# Patient Record
Sex: Male | Born: 1985 | Hispanic: No | Marital: Married | Smoking: Never smoker
Health system: Southern US, Community
[De-identification: ages and names within clinical notes are randomized; demographics above are authoritative.]

## PROBLEM LIST (undated history)

## (undated) DIAGNOSIS — K219 Gastro-esophageal reflux disease without esophagitis: Secondary | ICD-10-CM

## (undated) DIAGNOSIS — G8929 Other chronic pain: Secondary | ICD-10-CM

## (undated) DIAGNOSIS — F909 Attention-deficit hyperactivity disorder, unspecified type: Secondary | ICD-10-CM

## (undated) DIAGNOSIS — R6882 Decreased libido: Secondary | ICD-10-CM

## (undated) DIAGNOSIS — F419 Anxiety disorder, unspecified: Secondary | ICD-10-CM

## (undated) DIAGNOSIS — E291 Testicular hypofunction: Secondary | ICD-10-CM

## (undated) DIAGNOSIS — Z87898 Personal history of other specified conditions: Secondary | ICD-10-CM

## (undated) DIAGNOSIS — M549 Dorsalgia, unspecified: Secondary | ICD-10-CM

## (undated) HISTORY — DX: Gastro-esophageal reflux disease without esophagitis: K21.9

## (undated) HISTORY — DX: Anxiety disorder, unspecified: F41.9

## (undated) HISTORY — DX: Attention-deficit hyperactivity disorder, unspecified type: F90.9

## (undated) HISTORY — DX: Testicular hypofunction: E29.1

## (undated) HISTORY — DX: Decreased libido: R68.82

## (undated) HISTORY — PX: NASAL SEPTUM SURGERY: SHX37

## (undated) HISTORY — DX: Other chronic pain: G89.29

## (undated) HISTORY — DX: Personal history of other specified conditions: Z87.898

## (undated) HISTORY — PX: WISDOM TOOTH EXTRACTION: SHX21

## (undated) HISTORY — DX: Dorsalgia, unspecified: M54.9

---
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
## 2007-08-03 ENCOUNTER — Ambulatory Visit (HOSPITAL_BASED_OUTPATIENT_CLINIC_OR_DEPARTMENT_OTHER): Admission: RE | Admit: 2007-08-03 | Discharge: 2007-08-03 | Payer: Self-pay | Admitting: Orthopedic Surgery

## 2007-10-08 DIAGNOSIS — F419 Anxiety disorder, unspecified: Secondary | ICD-10-CM

## 2007-10-08 HISTORY — DX: Anxiety disorder, unspecified: F41.9

## 2011-02-19 NOTE — Op Note (Signed)
NAME:  Jose Briggs, Jose Briggs NO.:  [id number]   MEDICAL RECORD NO.:  [id number]          PATIENT TYPE:  AMB   LOCATION:  [facility]                          FACILITY:  [facility]   PHYSICIAN:  Robert A. Thurston Hole, M.D. DATE OF BIRTH:  27-Nov-1985   DATE OF PROCEDURE:  08/03/2007  DATE OF DISCHARGE:                               OPERATIVE REPORT   PREOPERATIVE DIAGNOSES:  1. Left shoulder instability with Bankart lesion.  2. Left shoulder anterior SLAP tear.  3. Left shoulder loose body.  4. Left shoulder chondromalacia.   POSTOPERATIVE DIAGNOSES:  1. Left shoulder instability with Bankart lesion.  2. Left shoulder anterior SLAP tear.  3. Left shoulder loose body.  4. Left shoulder chondromalacia.   PROCEDURE:  1. Left shoulder EUA, followed by examination under anesthesia (EUA),      followed by arthroscopically-assisted Bankart repair using Arthrex      PushLock anchors x2.  2. Left shoulder anterior SLAP repair using Arthrex PushLock anchor      x1.  3. Left shoulder loose body excision.  4. Left shoulder chondroplasty.   SURGEON:  Elana Alm. Thurston Hole, M.D.   ASSISTANT:  Julien Girt, P.A.   ANESTHESIA:  General.   OPERATIVE TIME:  1 hour.   COMPLICATIONS:  None.   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  Exam under MRI has revealed a Bankart lesion and SLAP tear and loose  body.  He has failed conservative care and is now to undergo arthroscopy  and repair.   DESCRIPTION:  Jose Briggs was brought to the recovery room on August 03, 2007, after an interscalene block was placed in the holding room by  anesthesia.  He was placed on the operating room table in the supine  position.  After being placed under general anesthesia, his left  shoulder was examined.  He had full range of motion with  anterior-  inferior laxity on the left, more so than the right.  No posterior  instability.  He was then placed in a beach chair position and the  shoulder and arm were prepped using sterile DuraPrep and draped using  sterile technique.  He received Ancef 1 g preoperatively for  prophylaxis.  Initially, through a posterior arthroscopic portal the  arthroscope with a pump attachment was placed into an anterior portal  and arthroscopic probe was placed.  On initial inspection, there was a  loose body floating in the joint measuring 1 x 1 cm, and this was  removed.  The articular cartilage on the glenoid showed anterior-  inferior grade 3 chondral defects, comprising 25% of the anterior-  inferior glenoid and corresponding humeral head.  This was debrided.  He  had tearing of the anterior-inferior labrum, glenohumeral ligament  complex from the 6:30 position all the way up to the 12 o'clock  position, comprising an anterior SLAP tear so the entire anterior labrum  was torn.  The posterior labrum was intact.  The biceps tendon anchor  posteriorly was intact.  The biceps tendon was intact.  The rotator cuff  was thoroughly inspected, and this was found to be intact.  An accessory  anterior portal was then made.  Using arthroscopic debriders, the  anterior glenoid rim was debrided.  After this was done and using a  suture passer, a mattress suture was placed in an anterior-inferior  glenohumeral ligament labral complex and an Arthrex PushLock anchor was  used and deployed in the 7 o'clock position, thus reefing and repairing  the anterior-inferior labral glenohumeral ligament complex down to the  anterior-inferior glenoid rim.  The middle glenohumeral ligament and  labral complex then had a second mattress suture placed and a second  PushLock anchor was placed in a 9 o'clock position on the glenoid rim,  thus totally repairing and securing the middle glenohumeral ligament and  labral complex.   This completely eliminated the anterior-inferior  instability and restored anterior-inferior and middle glenohumeral  ligament and labral anatomy back to normal.  The superior labrum tear  and anterior portion of the SLAP tear was then secured with another  Arthrex PushLock anchor in the 11 o'clock position with a mattress  suture technique through this superior labral tissue, as well.  After  this PushLock anchor was deployed, there was complete repair noted of  this anterior SLAP tear.  The posterior labrum was stable.  At this  point, it was felt that all pathology had been satisfactorily addressed.  The instability had been eliminated and the labral tear and ligamentous  injuries had been completely repaired.  At this point, the arthroscopic  instruments were removed.  Portals closed with 3-0 nylon suture, sterile  dressings and a shoulder immobilizer applied.  The patient was then  awakened, extubated and taken to the recovery room in stable condition.  Needle and sponge counts were correct x2 at the end of the case.  Follow  up p.r.n., to be followed as an outpatient on Percocet and Robaxin and a  shoulder immobilizer.  See him back in the office in a week for sutures  out and followup.      Robert A. Thurston Hole, M.D.  Electronically Signed     RAW/MEDQ  D:  08/03/2007  T:  08/03/2007  Job:  [id number]

## 2011-07-17 LAB — POCT HEMOGLOBIN-HEMACUE
Hemoglobin: 16.1 — ABNORMAL HIGH
Operator id: 123881

## 2013-01-25 ENCOUNTER — Ambulatory Visit (INDEPENDENT_AMBULATORY_CARE_PROVIDER_SITE_OTHER): Payer: BC Managed Care – PPO | Admitting: Internal Medicine

## 2013-01-25 VITALS — BP 122/68 | HR 61 | Temp 98.0°F | Resp 16 | Ht 72.5 in | Wt 225.0 lb

## 2013-01-25 DIAGNOSIS — M25569 Pain in unspecified knee: Secondary | ICD-10-CM

## 2013-01-25 DIAGNOSIS — M25561 Pain in right knee: Secondary | ICD-10-CM

## 2013-01-25 NOTE — Progress Notes (Signed)
  Subjective:    Patient ID: Jose Briggs, male    DOB: 1985-11-07, 27 y.o.   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

## 2013-03-29 ENCOUNTER — Ambulatory Visit: Payer: BC Managed Care – PPO

## 2013-03-29 ENCOUNTER — Ambulatory Visit (INDEPENDENT_AMBULATORY_CARE_PROVIDER_SITE_OTHER): Payer: BC Managed Care – PPO | Admitting: Family Medicine

## 2013-03-29 VITALS — BP 132/79 | HR 93 | Temp 99.0°F | Resp 17 | Ht 72.5 in | Wt 219.0 lb

## 2013-03-29 DIAGNOSIS — M25571 Pain in right ankle and joints of right foot: Secondary | ICD-10-CM

## 2013-03-29 DIAGNOSIS — S93401A Sprain of unspecified ligament of right ankle, initial encounter: Secondary | ICD-10-CM

## 2013-03-29 DIAGNOSIS — M25579 Pain in unspecified ankle and joints of unspecified foot: Secondary | ICD-10-CM

## 2013-03-29 DIAGNOSIS — S93409A Sprain of unspecified ligament of unspecified ankle, initial encounter: Secondary | ICD-10-CM

## 2013-03-29 MED ORDER — HYDROCODONE-ACETAMINOPHEN 5-325 MG PO TABS: 1.0000 | ORAL_TABLET | Freq: Four times a day (QID) | ORAL | Status: DC | PRN

## 2013-03-29 NOTE — Progress Notes (Signed)
[address]   [place], [place]  [address]   [phone]  Subjective:    Patient ID: Jose Briggs, male    DOB: July 29, 1986, 27 y.o.   MRN: [id number]  HPI This 27 y.o. male presents for evaluation of ankle injury R.  Playing basketball today at 5:00pm, went up for lay up and twisted ankle.  History of ankle sprain in past and this one is the worst.  Nauseated after injury.  Lateral ankle pain.  Some medial pain and swelling.  Lateral ankle swelling.  +tingling in toes initially but has improved.  Foot is cold.  Unable to bear weight.   Review of Systems  Constitutional: Negative for fever, chills, diaphoresis and fatigue.  Musculoskeletal: Positive for joint swelling, arthralgias and gait problem. Negative for myalgias.  Neurological: Positive for numbness. Negative for weakness.  Hematological: Does not bruise/bleed easily.    Past Medical History  Diagnosis Date  . Anxiety   . Seizures     Past Surgical History  Procedure Laterality Date  . Shoulder surgery      Prior to Admission medications   Medication Sig Start Date End Date Taking? Authorizing Provider  HYDROcodone-acetaminophen (NORCO/VICODIN) 5-325 MG per tablet Take 1 tablet by mouth every 6 (six) hours as needed for pain. 03/29/13   Ethelda Chick, MD    No Known Allergies  History   Social History  . Marital Status: Single    Spouse Name: N/A    Number of Children: N/A  . Years of Education: college   Occupational History  . employed     [employer]   Social History Main Topics  . Smoking status: Never Smoker   . Smokeless tobacco: Not on file  . Alcohol Use: Yes  . Drug Use: No  . Sexually Active: Yes   Other Topics Concern  . Not on file   Social History Narrative  . No narrative on file    History reviewed. No pertinent family history.     Objective:   Physical Exam  Nursing note and vitals reviewed. Constitutional: He appears well-developed and well-nourished. No distress.    Cardiovascular:  Pulses:      Dorsalis pedis pulses are 2+ on the right side, and 2+ on the left side.  Capillary refill < 3 seconds.  Musculoskeletal:       Right ankle: He exhibits decreased range of motion and swelling. He exhibits no ecchymosis, no deformity, no laceration and normal pulse. Tenderness. Lateral malleolus and medial malleolus tenderness found. No CF ligament, no posterior TFL, no head of 5th metatarsal and no proximal fibula tenderness found. Achilles tendon normal. Achilles tendon exhibits no pain, no defect and normal Thompson's test results.  R ANKLE:  +SWELLING ALONG LATERAL MALLEOLUS; +TTP ALONG LATERAL MALLEOLUS; NO ECCHYMOSES; MILD TTP MEDIAL MALLEOLUS; TRACE SWELLING MEDIALLY; NO ACHILLES TTP; NO ANTERIOR TTP.  +TTP DISTAL LOWER LEG WITH CALF SQUEEZE/COMPRESSION. R FOOT: NO SWELLING; NON-TENDER THROUGHOUT; NO TTP WITH METATARSAL SQUEEZE.  Neurological: No sensory deficit.  Skin: Skin is warm and dry. No rash noted. He is not diaphoretic.  Psychiatric: He has a normal mood and affect. His behavior is normal.      [facility] reading (PRIMARY) by  Dr. Katrinka Blazing.  R ANKLE: NAD.   Assessment & Plan:  Right ankle pain - Plan: DG Ankle Complete Right  Sprain of right ankle, initial encounter  1. R ankle pain/sprain:  New.  Ice, elevate, Ibuprofen.  Fitted with sweedo ankle brace  and crutches; use crutches for next 48-72 hours and then wean. RTC if unable to bear weight in 5 days; rx for hydrocodone provided; note for work for next two days and then advised light duty for remainder of week.  Meds ordered this encounter  Medications  . HYDROcodone-acetaminophen (NORCO/VICODIN) 5-325 MG per tablet    Sig: Take 1 tablet by mouth every 6 (six) hours as needed for pain.    Dispense:  30 tablet    Refill:  0

## 2013-03-29 NOTE — Patient Instructions (Addendum)
1.  RETURN IN 5-7 DAYS IF UNABLE TO WALK WITHOUT CRUTCHES.   Acute Ankle Sprain with Phase I Rehab An acute ankle sprain is a partial or complete tear in one or more of the ligaments of the ankle due to traumatic injury. The severity of the injury depends on both the the number of ligaments sprained and the grade of sprain. There are 3 grades of sprains.   A grade 1 sprain is a mild sprain. There is a slight pull without obvious tearing. There is no loss of strength, and the muscle and ligament are the correct length.  A grade 2 sprain is a moderate sprain. There is tearing of fibers within the substance of the ligament where it connects two bones or two cartilages. The length of the ligament is increased, and there is usually decreased strength.  A grade 3 sprain is a complete rupture of the ligament and is uncommon. In addition to the grade of sprain, there are three types of ankle sprains.  Lateral ankle sprains: This is a sprain of one or more of the three ligaments on the outer side (lateral) of the ankle. These are the most common sprains. Medial ankle sprains: There is one large triangular ligament of the inner side (medial) of the ankle that is susceptible to injury. Medial ankle sprains are less common. Syndesmosis, "high ankle," sprains: The syndesmosis is the ligament that connects the two bones of the lower leg. Syndesmosis sprains usually only occur with very severe ankle sprains. SYMPTOMS  Pain, tenderness, and swelling in the ankle, starting at the side of injury that may progress to the whole ankle and foot with time.  "Pop" or tearing sensation at the time of injury.  Bruising that may spread to the heel.  Impaired ability to walk soon after injury. CAUSES   Acute ankle sprains are caused by trauma placed on the ankle that temporarily forces or pries the anklebone (talus) out of its normal socket.  Stretching or tearing of the ligaments that normally hold the joint in  place (usually due to a twisting injury). RISK INCREASES WITH:  Previous ankle sprain.  Sports in which the foot may land awkwardly (ie. basketball, volleyball, or soccer) or walking or running on uneven or rough surfaces.  Shoes with inadequate support to prevent sideways motion when stress occurs.  Poor strength and flexibility.  Poor balance skills.  Contact sports. PREVENTION   Warm up and stretch properly before activity.  Maintain physical fitness:  Ankle and leg flexibility, muscle strength, and endurance.  Cardiovascular fitness.  Balance training activities.  Use proper technique and have a coach correct improper technique.  Taping, protective strapping, bracing, or high-top tennis shoes may help prevent injury. Initially, tape is best; however, it loses most of its support function within 10 to 15 minutes.  Wear proper fitted protective shoes (High-top shoes with taping or bracing is more effective than either alone).  Provide the ankle with support during sports and practice activities for 12 months following injury. PROGNOSIS   If treated properly, ankle sprains can be expected to recover completely; however, the length of recovery depends on the degree of injury.  A grade 1 sprain usually heals enough in 5 to 7 days to allow modified activity and requires an average of 6 weeks to heal completely.  A grade 2 sprain requires 6 to 10 weeks to heal completely.  A grade 3 sprain requires 12 to 16 weeks to heal.  A syndesmosis sprain often  takes more than 3 months to heal. RELATED COMPLICATIONS   Frequent recurrence of symptoms may result in a chronic problem. Appropriately addressing the problem the first time decreases the frequency of recurrence and optimizes healing time. Severity of the initial sprain does not predict the likelihood of later instability.  Injury to other structures (bone, cartilage, or tendon).  A chronically unstable or arthritic ankle  joint is a possiblity with repeated sprains. TREATMENT Treatment initially involves the use of ice, medication, and compression bandages to help reduce pain and inflammation. Ankle sprains are usually immobilized in a walking cast or boot to allow for healing. Crutches may be recommended to reduce pressure on the injury. After immobilization, strengthening and stretching exercises may be necessary to regain strength and a full range of motion. Surgery is rarely needed to treat ankle sprains. MEDICATION   Nonsteroidal anti-inflammatory medications, such as aspirin and ibuprofen (do not take for the first 3 days after injury or within 7 days before surgery), or other minor pain relievers, such as acetaminophen, are often recommended. Take these as directed by your caregiver. Contact your caregiver immediately if any bleeding, stomach upset, or signs of an allergic reaction occur from these medications.  Ointments applied to the skin may be helpful.  Pain relievers may be prescribed as necessary by your caregiver. Do not take prescription pain medication for longer than 4 to 7 days. Use only as directed and only as much as you need. HEAT AND COLD  Cold treatment (icing) is used to relieve pain and reduce inflammation for acute and chronic cases. Cold should be applied for 10 to 15 minutes every 2 to 3 hours for inflammation and pain and immediately after any activity that aggravates your symptoms. Use ice packs or an ice massage.  Heat treatment may be used before performing stretching and strengthening activities prescribed by your caregiver. Use a heat pack or a warm soak. SEEK IMMEDIATE MEDICAL CARE IF:   Pain, swelling, or bruising worsens despite treatment.  You experience pain, numbness, discoloration, or coldness in the foot or toes.  New, unexplained symptoms develop (drugs used in treatment may produce side effects.) EXERCISES  PHASE I EXERCISES RANGE OF MOTION (ROM) AND STRETCHING  EXERCISES - Ankle Sprain, Acute Phase I, Weeks 1 to 2 These exercises may help you when beginning to restore flexibility in your ankle. You will likely work on these exercises for the 1 to 2 weeks after your injury. Once your physician, physical therapist, or athletic trainer sees adequate progress, he or she will advance your exercises. While completing these exercises, remember:   Restoring tissue flexibility helps normal motion to return to the joints. This allows healthier, less painful movement and activity.  An effective stretch should be held for at least 30 seconds.  A stretch should never be painful. You should only feel a gentle lengthening or release in the stretched tissue. RANGE OF MOTION - Dorsi/Plantar Flexion  While sitting with your right / left knee straight, draw the top of your foot upwards by flexing your ankle. Then reverse the motion, pointing your toes downward.  Hold each position for __________ seconds.  After completing your first set of exercises, repeat this exercise with your knee bent. Repeat __________ times. Complete this exercise __________ times per day.  RANGE OF MOTION - Ankle Alphabet  Imagine your right / left big toe is a pen.  Keeping your hip and knee still, write out the entire alphabet with your "pen." Make the letters  as large as you can without increasing any discomfort. Repeat __________ times. Complete this exercise __________ times per day.  STRENGTHENING EXERCISES - Ankle Sprain, Acute -Phase I, Weeks 1 to 2 These exercises may help you when beginning to restore strength in your ankle. You will likely work on these exercises for 1 to 2 weeks after your injury. Once your physician, physical therapist, or athletic trainer sees adequate progress, he or she will advance your exercises. While completing these exercises, remember:   Muscles can gain both the endurance and the strength needed for everyday activities through controlled  exercises.  Complete these exercises as instructed by your physician, physical therapist, or athletic trainer. Progress the resistance and repetitions only as guided.  You may experience muscle soreness or fatigue, but the pain or discomfort you are trying to eliminate should never worsen during these exercises. If this pain does worsen, stop and make certain you are following the directions exactly. If the pain is still present after adjustments, discontinue the exercise until you can discuss the trouble with your clinician. STRENGTH - Dorsiflexors  Secure a rubber exercise band/tubing to a fixed object (ie. table, pole) and loop the other end around your right / left foot.  Sit on the floor facing the fixed object. The band/tubing should be slightly tense when your foot is relaxed.  Slowly draw your foot back toward you using your ankle and toes.  Hold this position for __________ seconds. Slowly release the tension in the band and return your foot to the starting position. Repeat __________ times. Complete this exercise __________ times per day.  STRENGTH - Plantar-flexors   Sit with your right / left leg extended. Holding onto both ends of a rubber exercise band/tubing, loop it around the ball of your foot. Keep a slight tension in the band.  Slowly push your toes away from you, pointing them downward.  Hold this position for __________ seconds. Return slowly, controlling the tension in the band/tubing. Repeat __________ times. Complete this exercise __________ times per day.  STRENGTH - Ankle Eversion  Secure one end of a rubber exercise band/tubing to a fixed object (table, pole). Loop the other end around your foot just before your toes.  Place your fists between your knees. This will focus your strengthening at your ankle.  Drawing the band/tubing across your opposite foot, slowly, pull your little toe out and up. Make sure the band/tubing is positioned to resist the entire  motion.  Hold this position for __________ seconds. Have your muscles resist the band/tubing as it slowly pulls your foot back to the starting position.  Repeat __________ times. Complete this exercise __________ times per day.  STRENGTH - Ankle Inversion  Secure one end of a rubber exercise band/tubing to a fixed object (table, pole). Loop the other end around your foot just before your toes.  Place your fists between your knees. This will focus your strengthening at your ankle.  Slowly, pull your big toe up and in, making sure the band/tubing is positioned to resist the entire motion.  Hold this position for __________ seconds.  Have your muscles resist the band/tubing as it slowly pulls your foot back to the starting position. Repeat __________ times. Complete this exercises __________ times per day.  STRENGTH - Towel Curls  Sit in a chair positioned on a non-carpeted surface.  Place your right / left foot on a towel, keeping your heel on the floor.  Pull the towel toward your heel by only curling  your toes. Keep your heel on the floor.  If instructed by your physician, physical therapist, or athletic trainer, add weight to the end of the towel. Repeat __________ times. Complete this exercise __________ times per day. Document Released: 04/24/2005 Document Revised: 12/16/2011 Document Reviewed: 01/05/2009 [facility] Patient Information 2014 [place], [place].

## 2014-03-22 ENCOUNTER — Ambulatory Visit (INDEPENDENT_AMBULATORY_CARE_PROVIDER_SITE_OTHER): Payer: BC Managed Care – PPO | Admitting: Medical

## 2014-03-22 ENCOUNTER — Encounter: Payer: Self-pay | Admitting: Medical

## 2014-03-22 VITALS — BP 122/70 | HR 82 | Temp 98.2°F | Resp 16 | Ht 73.0 in | Wt 205.0 lb

## 2014-03-22 DIAGNOSIS — R5383 Other fatigue: Secondary | ICD-10-CM

## 2014-03-22 DIAGNOSIS — M549 Dorsalgia, unspecified: Secondary | ICD-10-CM

## 2014-03-22 DIAGNOSIS — H579 Unspecified disorder of eye and adnexa: Secondary | ICD-10-CM

## 2014-03-22 DIAGNOSIS — R0683 Snoring: Secondary | ICD-10-CM

## 2014-03-22 DIAGNOSIS — R0989 Other specified symptoms and signs involving the circulatory and respiratory systems: Secondary | ICD-10-CM

## 2014-03-22 DIAGNOSIS — R5381 Other malaise: Secondary | ICD-10-CM

## 2014-03-22 DIAGNOSIS — G478 Other sleep disorders: Secondary | ICD-10-CM

## 2014-03-22 DIAGNOSIS — R0609 Other forms of dyspnea: Secondary | ICD-10-CM

## 2014-03-22 DIAGNOSIS — G8929 Other chronic pain: Secondary | ICD-10-CM

## 2014-03-22 DIAGNOSIS — R6882 Decreased libido: Secondary | ICD-10-CM

## 2014-03-22 DIAGNOSIS — Z0000 Encounter for general adult medical examination without abnormal findings: Secondary | ICD-10-CM

## 2014-03-22 LAB — POCT URINALYSIS DIPSTICK
Bilirubin, UA: NEGATIVE
Blood, UA: NEGATIVE
Glucose, UA: NEGATIVE
KETONES UA: NEGATIVE
Leukocytes, UA: NEGATIVE
NITRITE UA: NEGATIVE
PH UA: 5
Protein, UA: NEGATIVE
SPEC GRAV UA: 1.015
UROBILINOGEN UA: NEGATIVE

## 2014-03-22 NOTE — Progress Notes (Signed)
Subjective:   HPI  Jose Briggs is a 28 y.o. male who presents for a complete physical.  New patient today.  I see his mother Jose Briggs as a patient as well.  Preventative care: Last ophthalmology visit:n/a Last dental visit:yes turner and Melynda RippleHobbs Last colonoscopy:n/a Last prostate exam: n/a Last EKG:n/a Last labs:new  Prior vaccinations: TD or Tdap:02/2014 Influenza:n/a Pneumococcal:n/a Shingles/Zostavax:n/a  Advanced directive:n/a Health care power of attorney:n/a Living will:n/a  Concerns: Having low libido, no energy, wants testosterone checked.  Been feeling this way x 1 year.  No prior injectable drugs/black market TST use.   Does snore, doesn't wake rested.  No PND, no fever, no night sweats, no witnessed apnea, no prior eval for TST/TSH.   Reviewed their medical, surgical, family, social, medication, and allergy history and updated chart as appropriate.  Past Medical History  Diagnosis Date  . 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 Anxiety 2009    white coat anxiety, test taking anxiety, no prior medications  . Chronic back pain     due to prior football injury, uses OTC ibuprofen  . Low libido     Past Surgical History  Procedure Laterality Date  . Shoulder surgery  2008    left RTC and labral repair  . Lymph node dissection  2005    due to enlarged node, cervical/neck, non-cancerous    History   Social History  . Marital Status: Single    Spouse Name: N/A    Number of Children: N/A  . Years of Education: college   Occupational History  . employed     [employer]   Social History Main Topics  . Smoking status: Never Smoker   . Smokeless tobacco: Not on file  . Alcohol Use: Yes     Comment: Occasional, 1 per month  . Drug Use: No  . Sexual Activity: Yes    Birth Control/ Protection: Condom   Other Topics Concern  . 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 Cancer Maternal Grandfather     Colon  . Stroke Neg Hx   . Heart disease Neg Hx   . Hypothyroidism Mother     No current outpatient prescriptions on file.  No Known Allergies   Review of Systems Constitutional: -fever, -chills, -sweats, -unexpected weight change, -decreased appetite, +fatigue Allergy: -sneezing, -itching, -congestion Dermatology: -changing moles, --rash, -lumps ENT: -runny nose, -ear pain, -sore throat, -hoarseness, -sinus pain, -teeth pain, - ringing in ears, -hearing loss, -nosebleeds Cardiology: -chest pain, -palpitations, -swelling, -difficulty breathing when lying flat, -waking up short of breath Respiratory: -cough, -shortness of breath, -difficulty breathing with exercise or exertion, -wheezing, -coughing up blood Gastroenterology: -abdominal pain, -nausea, -vomiting, -diarrhea, -constipation, -blood in stool, -changes in bowel movement, -difficulty swallowing or eating Hematology: -bleeding, -bruising  Musculoskeletal: -joint aches, -muscle aches, -joint swelling, +back pain, -neck pain, -cramping, -changes in gait Ophthalmology: denies vision changes, eye redness, itching, discharge Urology: -burning with urination, -difficulty urinating, -blood in urine, -urinary frequency, -urgency, -incontinence Neurology: -headache, -weakness, -tingling, -numbness, -memory loss, -falls, -  dizziness Psychology: -depressed mood, -agitation, -sleep problems     Objective:   Physical Exam  BP 122/70  Pulse 82  Temp(Src) 98.2 F (36.8 C) (Oral)  Resp 16  Ht 6\' 1"  (1.854 m)  Wt 205 lb (92.987 kg)   BMI 27.05 kg/m2  General appearance: alert, no distress, WD/WN, muscular build Skin: scattered acne, no worrisome lesions, tattoos left lateral flank HEENT: normocephalic, conjunctiva/corneas normal, sclerae anicteric, PERRLA, EOMi, nares patent, no discharge or erythema, pharynx normal Oral cavity: MMM, tongue seems a little asymetrically enlarged on the left, but subtle and unchanged per pt, no leukoplakia, teeth in good repair.   Neck: supple, no lymphadenopathy, no thyromegaly, no masses, normal ROM, 15.5" diameter Chest: non tender, normal shape and expansion Heart: RRR, normal S1, S2, no murmurs Lungs: CTA bilaterally, no wheezes, rhonchi, or rales Abdomen: +bs, soft, non tender, non distended, no masses, no hepatomegaly, no splenomegaly, no bruits Back: non tender, normal ROM, no scoliosis Musculoskeletal: left anterior and posterior laparoscopic shoulder surgery scars, upper extremities non tender, no obvious deformity, normal ROM throughout, lower extremities non tender, no obvious deformity, normal ROM throughout Extremities: no edema, no cyanosis, no clubbing Pulses: 2+ symmetric, upper and lower extremities, normal cap refill Neurological: alert, oriented x 3, CN2-12 intact, strength normal upper extremities and lower extremities, sensation normal throughout, DTRs 2+ throughout, no cerebellar signs, gait normal Psychiatric: normal affect, behavior normal, pleasant  GU: normal male external genitalia, circumcised, nontender, no masses, no hernia, no lymphadenopathy Rectal: deferred   Assessment and Plan :      Encounter Diagnoses  Name Primary?  . Routine general medical examination at a health care facility Yes  . Fatigue   . Low libido   . Chronic back pain   . Abnormal vision screen     Physical exam - discussed healthy lifestyle, diet, exercise, preventative care, vaccinations, and addressed their concerns.   Faituge, low libido - discussed posible causes - diet,  busy schedule, low T, thyroid, sleep apnea, other possibilities.  epworth sleep scale 5 score. Chronic back pain - minimal tenderness today. Is exercising and stretching regularly. Advised he see eye doctor for baseline eval.  See dentist regularly. Follow-up pending labs

## 2014-03-23 LAB — CBC WITH DIFFERENTIAL/PLATELET
BASOS ABS: 0 10*3/uL (ref 0.0–0.1)
Basophils Relative: 0 % (ref 0–1)
EOS ABS: 0.1 10*3/uL (ref 0.0–0.7)
Eosinophils Relative: 3 % (ref 0–5)
HEMATOCRIT: 46.3 % (ref 39.0–52.0)
HEMOGLOBIN: 16.2 g/dL (ref 13.0–17.0)
LYMPHS ABS: 1.2 10*3/uL (ref 0.7–4.0)
Lymphocytes Relative: 35 % (ref 12–46)
MCH: 32 pg (ref 26.0–34.0)
MCHC: 35 g/dL (ref 30.0–36.0)
MCV: 91.5 fL (ref 78.0–100.0)
Monocytes Absolute: 0.4 10*3/uL (ref 0.1–1.0)
Monocytes Relative: 12 % (ref 3–12)
NEUTROS ABS: 1.8 10*3/uL (ref 1.7–7.7)
Neutrophils Relative %: 50 % (ref 43–77)
Platelets: 220 10*3/uL (ref 150–400)
RBC: 5.06 MIL/uL (ref 4.22–5.81)
RDW: 13.7 % (ref 11.5–15.5)
WBC: 3.5 10*3/uL — AB (ref 4.0–10.5)

## 2014-03-23 LAB — COMPREHENSIVE METABOLIC PANEL
ALBUMIN: 4.3 g/dL (ref 3.5–5.2)
ALT: 39 U/L (ref 0–53)
AST: 50 U/L — ABNORMAL HIGH (ref 0–37)
Alkaline Phosphatase: 53 U/L (ref 39–117)
BILIRUBIN TOTAL: 0.4 mg/dL (ref 0.2–1.2)
BUN: 21 mg/dL (ref 6–23)
CHLORIDE: 103 meq/L (ref 96–112)
CO2: 27 meq/L (ref 19–32)
CREATININE: 0.95 mg/dL (ref 0.50–1.35)
Calcium: 9.4 mg/dL (ref 8.4–10.5)
Glucose, Bld: 92 mg/dL (ref 70–99)
Potassium: 4.7 mEq/L (ref 3.5–5.3)
Sodium: 139 mEq/L (ref 135–145)
Total Protein: 7 g/dL (ref 6.0–8.3)

## 2014-03-23 LAB — TSH: TSH: 0.674 u[IU]/mL (ref 0.350–4.500)

## 2014-03-23 LAB — LIPID PANEL
Cholesterol: 210 mg/dL — ABNORMAL HIGH (ref 0–200)
HDL: 54 mg/dL (ref >39)
LDL CALC: 128 mg/dL — AB (ref 0–99)
TRIGLYCERIDES: 139 mg/dL (ref <150)
Total CHOL/HDL Ratio: 3.9 Ratio
VLDL: 28 mg/dL (ref 0–40)

## 2014-03-23 LAB — TESTOSTERONE: TESTOSTERONE: 789 ng/dL (ref 300–890)

## 2014-07-14 ENCOUNTER — Ambulatory Visit (INDEPENDENT_AMBULATORY_CARE_PROVIDER_SITE_OTHER): Payer: BC Managed Care – PPO | Admitting: Medical

## 2014-07-14 ENCOUNTER — Telehealth: Payer: Self-pay | Admitting: Medical

## 2014-07-14 ENCOUNTER — Encounter: Payer: Self-pay | Admitting: Medical

## 2014-07-14 VITALS — BP 120/70 | HR 60 | Temp 98.2°F | Resp 15 | Wt 202.0 lb

## 2014-07-14 DIAGNOSIS — L739 Follicular disorder, unspecified: Secondary | ICD-10-CM

## 2014-07-14 DIAGNOSIS — N521 Erectile dysfunction due to diseases classified elsewhere: Secondary | ICD-10-CM

## 2014-07-14 DIAGNOSIS — F419 Anxiety disorder, unspecified: Secondary | ICD-10-CM

## 2014-07-14 MED ORDER — VARDENAFIL HCL 10 MG PO TABS: 10.0000 mg | ORAL_TABLET | Freq: Every day | ORAL | Status: DC | PRN

## 2014-07-14 MED ORDER — SULFAMETHOXAZOLE-TMP DS 800-160 MG PO TABS: 1.0000 | ORAL_TABLET | Freq: Two times a day (BID) | ORAL | Status: DC

## 2014-07-14 NOTE — Progress Notes (Signed)
Subjective: Here for multiple concerns  Having tough time with anxiety lately.  Anxiety worse since last visit.  Last visit at physical he did mention low sex drive, fatigue, sleep problem, anxiety.  someday's is fine, sometimes is awful.  worries about things, can't focus at times, constant worry about stuff, overthinking things.   He believes that although the symptoms have been much worse in last 2 months, he thinks he has had problems with this for longer keep her member including in high school.  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  Relationship with girlfriend fine except for decreased desire, and due to anxiety, seems to have problems even get erections.  For the past month, can't seem to get an erection at all, and is in a cycle now where when he attempts to get erection, he worries and nothing happens it makes next time even worse. These feelings are anytime of day.  Feels on edge a lot, can't unwind and relax.  Parents live in [place].  Talks to mom regularly, talks to step father every now and then, eats lunch with mom Fridays.  relationship with them is fine.  Denies issues with reprimands or performance problems per boss.    She recently started getting a new rash, break out the skin all over the back, upper chest and some on the face suggestive of acne. Using nothing for this  Review of systems as in subjective  Objective: General: Well-developed, well-nourished, no acute distress Skin: Diffuse papular and pustular lesions throughout back in upper arms and some on chest consistent with folliculitis Heart: RRR, normal S1, S2, no murmurs Lungs clear Psych: Pleasant, good eye contact, answers questions appropriately  Assessment: Encounter Diagnoses  Name Primary?  Jose Briggs. Anxiety disorder, unspecified anxiety disorder type Yes  . Erectile dysfunction due to diseases classified elsewhere   . Folliculitis    Plan: Anxiety-discussed his concerns,  ways to deal with stress and anxiety, referral to therapist, discussed possible medication.  Erectile dysfunction-psychogenic, due to anxiety. Discussed his concerns, non pharmacological ways to help improve the situation. We also discussed off label use of small dose of Levitra to help "prime the pump," given his several recent unsuccessful attempts at getting erections.   Begin trial of Levitra, 1/4-1/2 tablet of the 10mg  dose.  Discussed potential risks of medications including hypotension and priapism.  Discussed proper use of medication.  Questions were answered.  Recheck 2-3 wk.   Folliculitis-begin antibiotic as below, discussed risk and benefits of medication, call or return if not improving within a week,

## 2014-07-14 NOTE — Patient Instructions (Signed)
Generalized Anxiety Disorder Generalized anxiety disorder (GAD) is a mental disorder. It interferes with life functions, including relationships, work, and school. GAD is different from normal anxiety, which everyone experiences at some point in their lives in response to specific life events and activities. Normal anxiety actually helps us prepare for and get through these life events and activities. Normal anxiety goes away after the event or activity is over.  GAD causes anxiety that is not necessarily related to specific events or activities. It also causes excess anxiety in proportion to specific events or activities. The anxiety associated with GAD is also difficult to control. GAD can vary from mild to severe. People with severe GAD can have intense waves of anxiety with physical symptoms (panic attacks).  SYMPTOMS The anxiety and worry associated with GAD are difficult to control. This anxiety and worry are related to many life events and activities and also occur more days than not for 6 months or longer. People with GAD also have three or more of the following symptoms (one or more in children):  Restlessness.   Fatigue.  Difficulty concentrating.   Irritability.  Muscle tension.  Difficulty sleeping or unsatisfying sleep. DIAGNOSIS GAD is diagnosed through an assessment by your health care provider. Your health care provider will ask you questions aboutyour mood,physical symptoms, and events in your life. Your health care provider may ask you about your medical history and use of alcohol or drugs, including prescription medicines. Your health care provider may also do a physical exam and blood tests. Certain medical conditions and the use of certain substances can cause symptoms similar to those associated with GAD. Your health care provider may refer you to a mental health specialist for further evaluation. TREATMENT The following therapies are usually used to treat GAD:    Medication. Antidepressant medication usually is prescribed for long-term daily control. Antianxiety medicines may be added in severe cases, especially when panic attacks occur.   Talk therapy (psychotherapy). Certain types of talk therapy can be helpful in treating GAD by providing support, education, and guidance. A form of talk therapy called cognitive behavioral therapy can teach you healthy ways to think about and react to daily life events and activities.  Stress managementtechniques. These include yoga, meditation, and exercise and can be very helpful when they are practiced regularly. A mental health specialist can help determine which treatment is best for you. Some people see improvement with one therapy. However, other people require a combination of therapies. Document Released: 01/18/2013 Document Revised: 02/07/2014 Document Reviewed: 01/18/2013 [facility] Patient Information 2015 [employer] [place]. This information is not intended to replace advice given to you by your health care provider. Make sure you discuss any questions you have with your health care provider.   Counseling services   [facility] [phone] office [email or web address] [address] [place], [place], [place] [address]  Gale JourneyLaura Atkinson, therapist  Franchot ErichsenErik Nelson, MA, clinical psychologist  Cognitive-Behavior Therapy; Mood Disorders; Anxiety Disorders; adult and child ADHD; Family Therapy; Stress Management; personal growth, and Marital Therapy.    Carlus Pavlovennis McKnight Ph.D., clinical psychologist Cognitive-Behavior Therapy; Mood Disorders; Anxiety Disorders; Stress     Management   [facility] [address], [place], [place] [address] [phone]   [facility] Sheran LuzDesiree Wilkinson, psychotherapist [address] [place], [place] [address] [phone]   Miguel AschoffElaine Talbert Ph.D., clinical psychologist [phone] office [address] [place], [place] [address] Cognitive Behavior Therapy, Depression, Bipolar, Anxiety, Grief and Loss

## 2014-07-14 NOTE — Telephone Encounter (Signed)
Refer to Jose SkiffLauren Briggs, counseling.  Counseling services   [facility] [phone] office [email or web address] [address] [place], [place], [place] [address]  Gale JourneyLaura Briggs, therapist

## 2014-07-15 NOTE — Telephone Encounter (Signed)
I called and referred the patient to Leotis ShamesLauren Atkinson's office

## 2014-08-22 ENCOUNTER — Ambulatory Visit (INDEPENDENT_AMBULATORY_CARE_PROVIDER_SITE_OTHER): Payer: BC Managed Care – PPO | Admitting: Medical

## 2014-08-22 ENCOUNTER — Encounter: Payer: Self-pay | Admitting: Medical

## 2014-08-22 VITALS — BP 124/78 | HR 64 | Temp 98.3°F | Resp 16 | Wt 206.0 lb

## 2014-08-22 DIAGNOSIS — L739 Follicular disorder, unspecified: Secondary | ICD-10-CM

## 2014-08-22 DIAGNOSIS — N528 Other male erectile dysfunction: Secondary | ICD-10-CM

## 2014-08-22 DIAGNOSIS — F411 Generalized anxiety disorder: Secondary | ICD-10-CM

## 2014-08-22 DIAGNOSIS — F410 Panic disorder [episodic paroxysmal anxiety] without agoraphobia: Secondary | ICD-10-CM

## 2014-08-22 MED ORDER — SULFAMETHOXAZOLE-TRIMETHOPRIM 800-160 MG PO TABS: ORAL_TABLET | ORAL | Status: DC

## 2014-08-22 MED ORDER — LORAZEPAM 1 MG PO TABS: 1.0000 mg | ORAL_TABLET | Freq: Three times a day (TID) | ORAL | Status: DC

## 2014-08-22 NOTE — Progress Notes (Signed)
Subjective: Here for recheck.    Last visit discussed anxiety, erection and libido concerns, and skin issues.   Since last visit he talked with a friend who is a Child psychotherapistsocial worker, but not a formal counseling session.   She did recommend he talk with me about prn medication for panic feeling/palpations, and discussed some ways for him to deal with anxiety.  still having a few days per week where he gets panicky, palpitations, chest pressure gets in a panic.  Most the time this is worrying about unknown things or things out of his control  Skin - during the week of Bactrim, skin cleared up significantly but as the medication ran out, the skin flared up again.  Since last visit he realized he wasn't eating but 1500 calories daily and given his activity level and body habitus, felt like this was under caloric intake needed.  After making some calorie changes, hasn't had any more problems with libido or sexual function.  Never took Levitra.    Review of systems as in subjective    Objective: General: Well-developed, well-nourished, no acute distress Skin: Diffuse small papular lesions throughout back in upper arms and some on chest consistent with folliculitis, but somewhat improved since last visit Heart: RRR, normal S1, S2, no murmurs Lungs clear Psych: Pleasant, good eye contact, answers questions appropriately    Assessment: Encounter Diagnoses  Name Primary?  . Panic attack Yes  . Anxiety state   . Other male erectile dysfunction   . Folliculitis     Plan: Anxiety, panic attack - discussed his concerns, ways to deal with stress and anxiety, c/t plan to see therapist, discussed possible medications, and he will use prn Lorazepam.   Discussed risks/benefits of medication.   Call report in 2 wk.   ED - seems to be much improved, possible component of psychogenic, but improved once he increased his calorie intake.     Folliculitis - C/t Bactrim extended regimen, call report 2 wk.

## 2015-08-28 ENCOUNTER — Telehealth: Payer: Self-pay | Admitting: Medical

## 2015-08-28 ENCOUNTER — Encounter: Payer: Self-pay | Admitting: Medical

## 2015-08-28 ENCOUNTER — Ambulatory Visit (INDEPENDENT_AMBULATORY_CARE_PROVIDER_SITE_OTHER): Payer: Commercial Managed Care - PPO | Admitting: Medical

## 2015-08-28 VITALS — BP 108/70 | HR 73 | Wt 228.0 lb

## 2015-08-28 DIAGNOSIS — L739 Follicular disorder, unspecified: Secondary | ICD-10-CM | POA: Diagnosis not present

## 2015-08-28 DIAGNOSIS — F411 Generalized anxiety disorder: Secondary | ICD-10-CM | POA: Diagnosis not present

## 2015-08-28 DIAGNOSIS — R4586 Emotional lability: Secondary | ICD-10-CM

## 2015-08-28 DIAGNOSIS — F39 Unspecified mood [affective] disorder: Secondary | ICD-10-CM | POA: Diagnosis not present

## 2015-08-28 MED ORDER — CITALOPRAM HYDROBROMIDE 20 MG PO TABS: ORAL_TABLET | ORAL | Status: DC

## 2015-08-28 MED ORDER — MINOCYCLINE HCL 100 MG PO TABS: 100.0000 mg | ORAL_TABLET | Freq: Every day | ORAL | Status: DC

## 2015-08-28 MED ORDER — LORAZEPAM 1 MG PO TABS: 1.0000 mg | ORAL_TABLET | Freq: Three times a day (TID) | ORAL | Status: DC

## 2015-08-28 NOTE — Telephone Encounter (Signed)
Called [facility] with Rx Lorazepam had to leave it on voice mail because pharmacy is closed til tomorrow.  Pt was informed and he was ok waiting til tomorrow.

## 2015-08-28 NOTE — Progress Notes (Signed)
Subjective: Here for recheck.  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  Working on patient contact hours.  Since last visit has been seeing [facility], Harley-Davidsonlese Pate.she recommended he try a mild antidepressant to help with his mood.   He is doing better with coping for anxiety and panic attack.   Has used prn Ativan prior.  However, counselor thinks he could benefit from daily medication.   Wanted him to come up and discuss.  Seeing counseling weekly x last 3 months.   Has some things in childhood that he realizes does still cause him concern.  No SI/HI.  No other aggravating or relieving factors.  Still having skin problems we've discussed before, folliculitis, on back, upper arms, thighs, buttocks.   Review of systems as in subjective   Objective: General: Well-developed, well-nourished, no acute distress Psych: Pleasant, good eye contact, answers questions appropriately Skin: scattered papular pink to red 1-2 mm round lesions suggestive of folliculitis   Assessment: Encounter Diagnoses  Name Primary?  . Generalized anxiety disorder Yes  . Mood changes (HCC)     Plan: Anxiety, mood changes - will try and consult by phone with his counselor.  He will sign HIPPA forms today.  Begin trial of Citalopram.   C/t counseling.  discussed risks/benefits of medication. F/u 2wk  follicle - begin trial of daily minocycline, daily antihistamine QHS.  F/u 43mo

## 2015-09-15 ENCOUNTER — Ambulatory Visit (INDEPENDENT_AMBULATORY_CARE_PROVIDER_SITE_OTHER): Payer: Commercial Managed Care - PPO | Admitting: Medical

## 2015-09-15 ENCOUNTER — Encounter: Payer: Self-pay | Admitting: Medical

## 2015-09-15 VITALS — BP 124/90 | HR 68 | Wt 226.0 lb

## 2015-09-15 DIAGNOSIS — G4700 Insomnia, unspecified: Secondary | ICD-10-CM

## 2015-09-15 DIAGNOSIS — L700 Acne vulgaris: Secondary | ICD-10-CM

## 2015-09-15 DIAGNOSIS — L739 Follicular disorder, unspecified: Secondary | ICD-10-CM | POA: Diagnosis not present

## 2015-09-15 HISTORY — DX: Acne vulgaris: L70.0

## 2015-09-15 HISTORY — DX: Insomnia, unspecified: G47.00

## 2015-09-15 MED ORDER — LORAZEPAM 1 MG PO TABS: 1.0000 mg | ORAL_TABLET | Freq: Three times a day (TID) | ORAL | Status: DC

## 2015-09-15 MED ORDER — MUPIROCIN 2 % EX OINT: 1.0000 "application " | TOPICAL_OINTMENT | Freq: Three times a day (TID) | CUTANEOUS | Status: DC

## 2015-09-15 NOTE — Progress Notes (Signed)
Subjective: Here for recheck.  I saw him 08/28/15 for anxiety, acne.   Since last visit he has started Citalopram.  Seeing some improvement, not as irritable, not as anxious about things.   Has only had 2 days where he felt possible side effect.   2 days after going to whole tablet daily, felt irritable that day.  On another random day this past week, felt more emotional that usual.  Has had a few times where he couldn't reach orgasm.  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  Working on patient contact hours.  Seeing [facility], Alese Pate  .she recommended he try a mild antidepressant to help with his mood.   He is doing better with coping for anxiety and panic attack.   Seeing counseling weekly x last 3 months.   Has some things in childhood that he realizes does still cause him concern.  No SI/HI.    Have trouble getting and staying asleep.   Still having skin problems we've discussed before, folliculitis, on back, upper arms, thighs, buttocks. Just started minocycline this week.    Review of systems as in subjective   Objective: General: Well-developed, well-nourished, no acute distress Psych: Pleasant, good eye contact, answers questions appropriately Skin: scattered papular pink to red 1-2 mm round lesions suggestive of folliculitis, right upper thigh with a little larger single papular tender lesion   Assessment: Encounter Diagnoses  Name Primary?  . Acne vulgaris Yes  . Insomnia   . Folliculitis      Plan: Anxiety, mood changes - c/t Citalopram, discusses some of the recent adjustments in his symptoms.   Will stay the same course for now on Citalopram.   C/t counseling.  discussed risks/benefits of medication. F/u 57mo by phone  Insomnia - practice good sleep hygiene as discussed, c/t lorazepam prn short term  follicle -c/t minocycline, good hygiene, begin mupirocin ointment topically to right thigh lesion.   If worse in the next several  days call back, otherwise given minocycline more time.

## 2015-10-18 ENCOUNTER — Telehealth: Payer: Self-pay | Admitting: Medical

## 2015-10-18 NOTE — Telephone Encounter (Signed)
Pt called and requested refill of ativan. Please call [phone] when ready.

## 2015-10-18 NOTE — Telephone Encounter (Signed)
Call in and electronically order 1 mo supply of Ativan

## 2015-10-19 MED ORDER — LORAZEPAM 1 MG PO TABS: 1.0000 mg | ORAL_TABLET | Freq: Three times a day (TID) | ORAL | Status: DC

## 2015-10-19 NOTE — Telephone Encounter (Signed)
Phoned in to pharmacy and ordered in chart

## 2015-10-23 ENCOUNTER — Telehealth: Payer: Self-pay | Admitting: Medical

## 2015-10-23 NOTE — Telephone Encounter (Signed)
Left message that pt needs flu shot

## 2015-11-10 ENCOUNTER — Other Ambulatory Visit: Payer: Self-pay | Admitting: Medical

## 2015-11-10 NOTE — Telephone Encounter (Signed)
Is this ok to refill?  

## 2015-11-14 ENCOUNTER — Telehealth: Payer: Self-pay | Admitting: Medical

## 2015-11-14 NOTE — Telephone Encounter (Signed)
Pt states needs refill on his Lorazepam, advised was denied.  Appt was made for tomorrow to discuss

## 2015-11-15 ENCOUNTER — Ambulatory Visit (INDEPENDENT_AMBULATORY_CARE_PROVIDER_SITE_OTHER): Payer: Commercial Managed Care - PPO | Admitting: Medical

## 2015-11-15 ENCOUNTER — Encounter: Payer: Self-pay | Admitting: Medical

## 2015-11-15 VITALS — BP 128/72 | HR 74 | Wt 232.0 lb

## 2015-11-15 DIAGNOSIS — G4700 Insomnia, unspecified: Secondary | ICD-10-CM

## 2015-11-15 DIAGNOSIS — F4323 Adjustment disorder with mixed anxiety and depressed mood: Secondary | ICD-10-CM

## 2015-11-15 DIAGNOSIS — F411 Generalized anxiety disorder: Secondary | ICD-10-CM | POA: Diagnosis not present

## 2015-11-15 MED ORDER — LORAZEPAM 1 MG PO TABS: 1.0000 mg | ORAL_TABLET | Freq: Every day | ORAL | Status: DC

## 2015-11-15 MED ORDER — CITALOPRAM HYDROBROMIDE 40 MG PO TABS: 40.0000 mg | ORAL_TABLET | Freq: Every day | ORAL | Status: DC

## 2015-11-15 NOTE — Progress Notes (Signed)
Subjective: Chief Complaint  Patient presents with  . Follow-up    anxiety wants refill on lorazepam. wants to talk about celexa dosage, said he thinks it is going so so    Here for f/u on anxiety, insomnia, accompanied by fiance today.  So far still having anxiety issues.  Fiance seems some improvements but he notes not a lot of improvement in mood.  Still goes to be nightly worrying about thinks, mind racing a million miles.  She notes on the days he uses the sleep aid lorazepam, has much better day the following day.   Still seeing [facility] - sees counselor every 2 weeks.  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   Still studying to take GRE soon, pursing PA school.   In general his fiance notes that he has no self confidence.  Everyone else praises him and speaks highly of him but he is very critical of himself, has low self esteem.   No other aggravating or relieving factors. No other complaint.  Review of systems as in subjective   Objective: General: Well-developed, well-nourished, no acute distress Psych: Pleasant, good eye contact, answers questions appropriately   Assessment: Encounter Diagnoses  Name Primary?  . Generalized anxiety disorder Yes  . Adjustment disorder with mixed anxiety and depressed mood   . Insomnia      Plan: Anxiety, mood changes - increase Citalopram to  daily.   C/t counseling.   Discussed having mentor, setting short term goals, and working on strategies to build self confidence.  Recommended he establish with a church home.  Insomnia - practice good sleep hygiene as discussed, c/t lorazepam prn short term, but if doing better on citalopram in 2wk, change to trial of Trazodone.

## 2016-03-21 ENCOUNTER — Encounter: Payer: Self-pay | Admitting: Medical

## 2016-03-21 ENCOUNTER — Ambulatory Visit (INDEPENDENT_AMBULATORY_CARE_PROVIDER_SITE_OTHER): Payer: Commercial Managed Care - PPO | Admitting: Medical

## 2016-03-21 VITALS — BP 130/90 | HR 84 | Wt 233.0 lb

## 2016-03-21 DIAGNOSIS — R635 Abnormal weight gain: Secondary | ICD-10-CM

## 2016-03-21 DIAGNOSIS — R030 Elevated blood-pressure reading, without diagnosis of hypertension: Secondary | ICD-10-CM | POA: Diagnosis not present

## 2016-03-21 DIAGNOSIS — F40243 Fear of flying: Secondary | ICD-10-CM

## 2016-03-21 DIAGNOSIS — F411 Generalized anxiety disorder: Secondary | ICD-10-CM

## 2016-03-21 DIAGNOSIS — T50905A Adverse effect of unspecified drugs, medicaments and biological substances, initial encounter: Secondary | ICD-10-CM

## 2016-03-21 DIAGNOSIS — G4700 Insomnia, unspecified: Secondary | ICD-10-CM | POA: Diagnosis not present

## 2016-03-21 DIAGNOSIS — R358 Other polyuria: Secondary | ICD-10-CM | POA: Diagnosis not present

## 2016-03-21 DIAGNOSIS — T753XXA Motion sickness, initial encounter: Secondary | ICD-10-CM

## 2016-03-21 DIAGNOSIS — R3589 Other polyuria: Secondary | ICD-10-CM

## 2016-03-21 HISTORY — DX: Generalized anxiety disorder: F41.1

## 2016-03-21 MED ORDER — CITALOPRAM HYDROBROMIDE 20 MG PO TABS: 20.0000 mg | ORAL_TABLET | Freq: Every day | ORAL | Status: DC

## 2016-03-21 MED ORDER — LORAZEPAM 1 MG PO TABS: 1.0000 mg | ORAL_TABLET | Freq: Every day | ORAL | Status: DC

## 2016-03-21 MED ORDER — SCOPOLAMINE 1 MG/3DAYS TD PT72: 1.0000 | MEDICATED_PATCH | TRANSDERMAL | Status: DC

## 2016-03-21 NOTE — Progress Notes (Signed)
Subjective: Chief Complaint  Patient presents with  . Advice Only    wants to know if he can get medication for plane anxiety. and also wants medication to prevent sea sickness. celexa is making him gain weight, wants to see about changing it. is on a diet and exercising.    Here for questions.    Getting married Saturday, leaving for honeymoon Sunday.   Will be on cruise and air plane.   Is scared of planes, wants something to help calm him down for plain flight.   Never been on cruise, wants some for seasickness just in case.       He is still taking 40mg  Citalopram.   He notes that since he started citalopram, has gained about 15-20lb.   Diet and exercise is the same.  He does note anxiety is improved, not having as much problem of late in this regard.  He wonders if the weight gain is just related to the citalopram medication  He has been having some increased urination, wonders about diabetes.    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   Still studying to take GRE soon, pursing PA school.  No other aggravating or relieving factors. No other complaint.  Review of systems as in subjective   Objective: BP 130/90 mmHg  Pulse 84  Wt 233 lb (105.688 kg)  Wt Readings from Last 3 Encounters:  03/21/16 233 lb (105.688 kg)  11/15/15 232 lb (105.235 kg)  09/15/15 226 lb (102.513 kg)   BP Readings from Last 3 Encounters:  03/21/16 130/90  11/15/15 128/72  09/15/15 124/90   General: Well-developed, well-nourished, no acute distress Psych: Pleasant, good eye contact, answers questions appropriately   Assessment: Encounter Diagnoses  Name Primary?  . Generalized anxiety disorder   . Insomnia Yes  . Motion sickness, initial encounter   . Fear of flying   . Adverse effects of medication, initial encounter   . Polyuria   . Weight gain   . Elevated blood-pressure reading without diagnosis of hypertension      Plan: Anxiety - cut back to citalopram 20mg   due to weight gain, continue counseling, congratulated him on his wedding.  F/u in a few weeks  insomnia - c/t Ativan prn  Motion sickness - begin Scopolamine patch prn   Fear of flying - can use Ativan prn for this  Adverse effect of medication, weight gain - cut back to Citalopram 20mg  daily  Polyuria - UA without glucosuria.   F/u soon for physical and fasting labs  Elevated BP - f/u soon for physical and fasting labs   Jose Custardaron was seen today for advice only.  Diagnoses and all orders for this visit:  Insomnia  Generalized anxiety disorder  Motion sickness, initial encounter  Fear of flying  Adverse effects of medication, initial encounter -     POCT urinalysis dipstick  Polyuria -     POCT urinalysis dipstick  Weight gain  Elevated blood-pressure reading without diagnosis of hypertension  Other orders -     LORazepam (ATIVAN) 1 MG tablet; Take 1 tablet (1 mg total) by mouth at bedtime. -     scopolamine (TRANSDERM-SCOP, 1.5 MG,) 1 MG/3DAYS; Place 1 patch (1.5 mg total) onto the skin every 3 (three) days. -     citalopram (CELEXA) 20 MG tablet; Take 1 tablet (20 mg total) by mouth daily.

## 2016-03-22 LAB — POCT URINALYSIS DIPSTICK
BILIRUBIN UA: NEGATIVE
GLUCOSE UA: NEGATIVE
Ketones, UA: NEGATIVE
Leukocytes, UA: NEGATIVE
NITRITE UA: NEGATIVE
Protein, UA: NEGATIVE
RBC UA: NEGATIVE
SPEC GRAV UA: 1.02
Urobilinogen, UA: NEGATIVE
pH, UA: 7.5

## 2016-06-04 ENCOUNTER — Ambulatory Visit (INDEPENDENT_AMBULATORY_CARE_PROVIDER_SITE_OTHER): Payer: Managed Care, Other (non HMO) | Admitting: Medical

## 2016-06-04 ENCOUNTER — Encounter: Payer: Self-pay | Admitting: Medical

## 2016-06-04 VITALS — BP 130/88 | HR 91 | Temp 98.7°F | Wt 234.0 lb

## 2016-06-04 DIAGNOSIS — J342 Deviated nasal septum: Secondary | ICD-10-CM | POA: Diagnosis not present

## 2016-06-04 DIAGNOSIS — J40 Bronchitis, not specified as acute or chronic: Secondary | ICD-10-CM | POA: Diagnosis not present

## 2016-06-04 DIAGNOSIS — J329 Chronic sinusitis, unspecified: Secondary | ICD-10-CM

## 2016-06-04 MED ORDER — HYDROCODONE-HOMATROPINE 5-1.5 MG/5ML PO SYRP: 5.0000 mL | ORAL_SOLUTION | Freq: Four times a day (QID) | ORAL | 0 refills | Status: DC | PRN

## 2016-06-04 MED ORDER — CLARITHROMYCIN 500 MG PO TABS: 500.0000 mg | ORAL_TABLET | Freq: Two times a day (BID) | ORAL | 0 refills | Status: DC

## 2016-06-04 NOTE — Progress Notes (Signed)
Subjective: Chief Complaint  Patient presents with  . sick    started on the 17th. sore throat at first, and coughing. congestion recently started. taken OTC nyquil and dayquil. [facility]. has had body aches and fatigue. sinus pressure and still has a cough. nose has been bleeding.    Started about 2 weeks ago.   Started with sore throat, progressed in severity with congestion in head.   Been using Nyquil and Dayquil combination.  Went to urgent care Sunday the 05/26/16.   Was negative for strep, advised benadryl and maalox, this hasn't helped either.   No other medication prescribed.  Has felt a little SOB.  As time has went on, got sinus pressure, sinus congestion, still has the annoying cough, yellow nasal discharge. No sick contacts.  Tried sudafed last few days but this brought on dryness and nose bleeds.  Cough worse all night when lying down.  No other aggravating or relieving factors. No other complaint.  Past Medical History:  Diagnosis Date  . Anxiety 2009   white coat anxiety, test taking anxiety, no prior medications  . Chronic back pain    due to prior football injury, uses OTC ibuprofen  . History of seizure    age 89yo; History of med-induced seizure once only due to Ritalin; no seizures since  . Low libido    Current Outpatient Prescriptions on File Prior to Visit  Medication Sig Dispense Refill  . citalopram (CELEXA) 20 MG tablet Take 1 tablet (20 mg total) by mouth daily. 30 tablet 1   No current facility-administered medications on file prior to visit.      ROS as in subjective   Objective: BP 130/88   Pulse 91   Temp 98.7 F (37.1 C) (Tympanic)   Wt 234 lb (106.1 kg)   BMI 30.87 kg/m   Wt Readings from Last 3 Encounters:  06/04/16 234 lb (106.1 kg)  03/21/16 233 lb (105.7 kg)  11/15/15 232 lb (105.2 kg)   General appearance: Alert, WD/WN, no distress, coughing a lot                             Skin: warm, no rash, no diaphoresis                          Head: +frontal sinus tenderness                            Eyes: conjunctiva normal, corneas clear, PERRLA                            Ears: pearly TMs, external ear canals normal                          Nose: septum deviated leftward, turbinates swollen, with erythema and clear discharge             Mouth/throat: MMM, tongue normal, mild pharyngeal erythema                           Neck: supple, no adenopathy, no thyromegaly, non tender  Heart: RRR, normal S1, S2, no murmurs                         Lungs: +bronchial breath sounds, no rhonchi, no wheezes, no rales                Extremities: no edema, non tender   Assessment: Encounter Diagnoses  Name Primary?  . Sinobronchitis Yes  . Deviated septum     Plan: Discussed symptoms, recommendations, hydrate well, c/t nasal saline flush.   Begin Biaxin, hycodan syrup prn.  Discussed possible sedation with the hycodan.  Rest. If not much improved by end of the week, then call or recheck.   Glad to hear his medication for mood is working and is seeing improvement with skin issues per recent medications from dermatology  He notes somewhat frequent sinus issues where he doesn't come in to get evaluated.   He may end up wanting to see ENT.   We don't have numerous ddocumentedssinusitisttreatments but if wishes we can refer to ENT.  I will wait on him to let me know.  Ryoma was seen today for sick.  Diagnoses and all orders for this visit:  Sinobronchitis  Deviated septum  Other orders -     HYDROcodone-homatropine (HYCODAN) 5-1.5 MG/5ML syrup; Take 5 mLs by mouth every 6 (six) hours as needed for cough. -     clarithromycin (BIAXIN) 500 MG tablet; Take 1 tablet (500 mg total) by mouth 2 (two) times daily.

## 2016-08-01 ENCOUNTER — Other Ambulatory Visit: Payer: Self-pay | Admitting: Medical

## 2016-08-09 ENCOUNTER — Encounter: Payer: Self-pay | Admitting: Medical

## 2016-08-09 ENCOUNTER — Ambulatory Visit (INDEPENDENT_AMBULATORY_CARE_PROVIDER_SITE_OTHER): Payer: Managed Care, Other (non HMO) | Admitting: Medical

## 2016-08-09 VITALS — BP 126/80 | HR 67 | Ht 73.0 in | Wt 235.4 lb

## 2016-08-09 DIAGNOSIS — Z6831 Body mass index (BMI) 31.0-31.9, adult: Secondary | ICD-10-CM | POA: Diagnosis not present

## 2016-08-09 DIAGNOSIS — F411 Generalized anxiety disorder: Secondary | ICD-10-CM | POA: Diagnosis not present

## 2016-08-09 DIAGNOSIS — Z1159 Encounter for screening for other viral diseases: Secondary | ICD-10-CM | POA: Diagnosis not present

## 2016-08-09 DIAGNOSIS — G4700 Insomnia, unspecified: Secondary | ICD-10-CM

## 2016-08-09 DIAGNOSIS — Z79899 Other long term (current) drug therapy: Secondary | ICD-10-CM | POA: Diagnosis not present

## 2016-08-09 DIAGNOSIS — Z7189 Other specified counseling: Secondary | ICD-10-CM

## 2016-08-09 DIAGNOSIS — Z7185 Encounter for immunization safety counseling: Secondary | ICD-10-CM | POA: Insufficient documentation

## 2016-08-09 DIAGNOSIS — Z111 Encounter for screening for respiratory tuberculosis: Secondary | ICD-10-CM

## 2016-08-09 DIAGNOSIS — F902 Attention-deficit hyperactivity disorder, combined type: Secondary | ICD-10-CM | POA: Diagnosis not present

## 2016-08-09 DIAGNOSIS — Z0000 Encounter for general adult medical examination without abnormal findings: Secondary | ICD-10-CM | POA: Insufficient documentation

## 2016-08-09 DIAGNOSIS — L700 Acne vulgaris: Secondary | ICD-10-CM | POA: Diagnosis not present

## 2016-08-09 DIAGNOSIS — R635 Abnormal weight gain: Secondary | ICD-10-CM | POA: Diagnosis not present

## 2016-08-09 DIAGNOSIS — L739 Follicular disorder, unspecified: Secondary | ICD-10-CM

## 2016-08-09 LAB — CBC
HCT: 42.4 % (ref 38.5–50.0)
Hemoglobin: 14.8 g/dL (ref 13.2–17.1)
MCH: 31.1 pg (ref 27.0–33.0)
MCHC: 34.9 g/dL (ref 32.0–36.0)
MCV: 89.1 fL (ref 80.0–100.0)
MPV: 10.7 fL (ref 7.5–12.5)
PLATELETS: 216 10*3/uL (ref 140–400)
RBC: 4.76 MIL/uL (ref 4.20–5.80)
RDW: 12.8 % (ref 11.0–15.0)
WBC: 2.9 10*3/uL — AB (ref 4.0–10.5)

## 2016-08-09 LAB — POCT URINALYSIS DIPSTICK
BILIRUBIN UA: NEGATIVE
Blood, UA: NEGATIVE
CLARITY UA: NEGATIVE
GLUCOSE UA: NEGATIVE
KETONES UA: NEGATIVE
Leukocytes, UA: NEGATIVE
Nitrite, UA: NEGATIVE
Protein, UA: NEGATIVE
Urobilinogen, UA: NEGATIVE
pH, UA: 6

## 2016-08-09 LAB — TSH: TSH: 0.84 m[IU]/L (ref 0.40–4.50)

## 2016-08-09 LAB — HEMOGLOBIN A1C
HEMOGLOBIN A1C: 5.1 % (ref <5.7)
Mean Plasma Glucose: 100 mg/dL

## 2016-08-09 MED ORDER — CITALOPRAM HYDROBROMIDE 20 MG PO TABS: ORAL_TABLET | ORAL | 3 refills | Status: DC

## 2016-08-09 NOTE — Progress Notes (Signed)
Subjective:   HPI  Jose Briggs is a 30 y.o. male who presents for a complete physical.  Here with wife who is a Counselling psychologistcounselor/social worker.  He just got accepted to [employer]. Will be moving there.  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  Has hx/o ADHD.  Was on medication as a child, but had seizure with Ritalin.  Doesn't think he ever was on another medication.  But wife comments on how he can't sit still, always fidgety, anxious, wonders if ADHD medication would be better than citalopram to help his focus and anxiety, particular as he starts graduate school in January.     Born in [place], for school needs Hep B titer and TB test.  Is apparently up to date on all other.  He had Flu shot last month  Exercises 4 days per week.  Reviewed their medical, surgical, family, social, medication, and allergy history and updated chart as appropriate.  Past Medical History:  Diagnosis Date  . Anxiety 2009   white coat anxiety, test taking anxiety, no prior medications  . Chronic back pain    due to prior football injury, uses OTC ibuprofen  . History of seizure    age 30yo; History of med-induced seizure once only due to Ritalin; no seizures since  . Low libido     Past Surgical History:  Procedure Laterality Date  . LYMPH NODE DISSECTION  2005   due to enlarged node, cervical/neck, non-cancerous  . SHOULDER SURGERY  2008   left RTC and labral repair    Social History   Social History  . Marital status: Single    Spouse name: N/A  . Number of children: N/A  . Years of education: college   Occupational History  . employed     [employer]   Social History Main Topics  . Smoking status: Never Smoker  . Smokeless tobacco: Never Used  . Alcohol use Yes     Comment: Occasional, 1 per month  . Drug use: No  . Sexual activity: Yes    Birth control/ protection: Condom   Other Topics Concern  . 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 Cancer Maternal Grandfather     Colon  . Stroke Neg Hx   . Heart disease Neg Hx   . Hypothyroidism Mother      Current Outpatient Prescriptions:  .  citalopram (CELEXA) 20 MG tablet, TAKE 1 TABLET(20 MG) BY MOUTH DAILY, Disp: 30 tablet, Rfl: 0  No Known Allergies   Review of Systems Constitutional: -fever, -chills, -sweats, -unexpected weight change, -decreased appetite, -fatigue Allergy: -sneezing, -itching, -congestion Dermatology: -changing moles, --rash, -lumps ENT: -runny nose, -ear pain, -sore throat, -hoarseness, -sinus pain, -teeth pain, - ringing in ears, -hearing loss, -nosebleeds Cardiology: -chest pain, -palpitations, -swelling, -difficulty breathing when lying flat, -waking up short of breath Respiratory: -cough, -shortness of breath, -difficulty breathing with exercise or exertion, -wheezing, -coughing up blood Gastroenterology: -abdominal pain, -nausea, -vomiting, -diarrhea, -constipation, -blood in stool, -changes in bowel movement, -difficulty swallowing or eating Hematology: -bleeding, -bruising  Musculoskeletal: -joint aches, -muscle aches, -joint swelling, +back pain, -neck  pain, -cramping, -changes in gait Ophthalmology: denies vision changes, eye redness, itching, discharge Urology: -burning with urination, -difficulty urinating, -blood in urine, -urinary frequency, -urgency, -incontinence Neurology: -headache, -weakness, -tingling, -numbness, -memory loss, -falls, -dizziness Psychology: -depressed mood, -agitation, -sleep problems     Objective:   Physical Exam  BP 126/80   Pulse 67   Ht 6\' 1"  (1.854 m)    Wt 235 lb 6.4 oz (106.8 kg)   SpO2 98%   BMI 31.06 kg/m   General appearance: alert, no distress, WD/WN, muscular build Skin: scattered acne, no worrisome lesions, tattoos left lateral flank HEENT: normocephalic, conjunctiva/corneas normal, sclerae anicteric, PERRLA, EOMi, nares patent, no discharge or erythema, pharynx normal Oral cavity: MMM, tongue seems a little asymmetrically enlarged on the left, but subtle and unchanged per pt, no leukoplakia, teeth in good repair.   Neck: supple, no lymphadenopathy, no thyromegaly, no masses, normal ROM, 15.5" diameter Chest: non tender, normal shape and expansion Heart: RRR, normal S1, S2, no murmurs Lungs: CTA bilaterally, no wheezes, rhonchi, or rales Abdomen: +bs, soft, non tender, non distended, no masses, no hepatomegaly, no splenomegaly, no bruits Back: non tender, normal ROM, no scoliosis Musculoskeletal: left anterior and posterior laparoscopic shoulder surgery scars, upper extremities non tender, no obvious deformity, normal ROM throughout, lower extremities non tender, no obvious deformity, normal ROM throughout Extremities: no edema, no cyanosis, no clubbing Pulses: 2+ symmetric, upper and lower extremities, normal cap refill Neurological: alert, oriented x 3, CN2-12 intact, strength normal upper extremities and lower extremities, sensation normal throughout, DTRs 2+ throughout, no cerebellar signs, gait normal Psychiatric: normal affect, behavior normal, pleasant  GU: normal male external genitalia, circumcised, nontender, no masses, possibly faint right inguinal hernia, otherwise no hernia, no lymphadenopathy Rectal: deferred   Adult ECG Report  Indication: physical, high risk medication use  Rate: 64 bpm  Rhythm: normal sinus rhythm  QRS Axis: 22 degrees  PR Interval: 142ms  QRS Duration: 92ms  QTc: 406ms  Conduction Disturbances: none  Other Abnormalities: none  Patient's cardiac risk factors are: none.  EKG comparison:  none  Narrative Interpretation: normal EKG   Assessment and Plan :      Encounter Diagnoses  Name Primary?  . Encounter for health maintenance examination in adult Yes  . Folliculitis   . Acne vulgaris   . Insomnia, unspecified type   . Generalized anxiety disorder   . Weight gain   . Vaccine counseling   . BMI 31.0-31.9,adult     Physical exam - discussed healthy lifestyle, diet, exercise, preventative care, vaccinations, and addressed their concerns.   Congratulated him on acceptance to PA school Skin looks good with his topical treatment prn use per dermatology C/t citalopram for now Will consider ADHD treatment options but concern with prior seizure with ritalin.  Will request records.  discussed sleep hygiene, possible therapies.  Reviewed labs he had at recent job biometric screen:   85 glucose 239 total cholesterol 163 LDL 53 HDL 4.5 ratio 116 TRIG BP 161/09125/82  See eye doctor and dentist regularly. Follow-up pending labs

## 2016-08-10 LAB — COMPREHENSIVE METABOLIC PANEL
ALK PHOS: 48 U/L (ref 40–115)
ALT: 30 U/L (ref 9–46)
AST: 25 U/L (ref 10–40)
Albumin: 4.5 g/dL (ref 3.6–5.1)
BUN: 12 mg/dL (ref 7–25)
CALCIUM: 9.2 mg/dL (ref 8.6–10.3)
CO2: 23 mmol/L (ref 20–31)
CREATININE: 1.09 mg/dL (ref 0.60–1.35)
Chloride: 102 mmol/L (ref 98–110)
Glucose, Bld: 89 mg/dL (ref 65–99)
Potassium: 4.5 mmol/L (ref 3.5–5.3)
Sodium: 138 mmol/L (ref 135–146)
Total Bilirubin: 0.5 mg/dL (ref 0.2–1.2)
Total Protein: 7.6 g/dL (ref 6.1–8.1)

## 2016-08-10 LAB — HEPATITIS B SURFACE ANTIBODY, QUANTITATIVE: Hepatitis B-Post: <5 m[IU]/mL

## 2016-08-14 ENCOUNTER — Other Ambulatory Visit (INDEPENDENT_AMBULATORY_CARE_PROVIDER_SITE_OTHER): Payer: Managed Care, Other (non HMO)

## 2016-08-14 DIAGNOSIS — Z23 Encounter for immunization: Secondary | ICD-10-CM | POA: Diagnosis not present

## 2016-08-14 LAB — QUANTIFERON TB GOLD ASSAY (BLOOD)
Interferon Gamma Release Assay: NEGATIVE
Mitogen-Nil: >10 IU/mL
QUANTIFERON TB AG MINUS NIL: 0.05 [IU]/mL
Quantiferon Nil Value: 0.08 IU/mL

## 2016-09-07 ENCOUNTER — Other Ambulatory Visit: Payer: Self-pay | Admitting: Medical

## 2016-09-16 ENCOUNTER — Other Ambulatory Visit (INDEPENDENT_AMBULATORY_CARE_PROVIDER_SITE_OTHER): Payer: Managed Care, Other (non HMO)

## 2016-09-16 DIAGNOSIS — Z23 Encounter for immunization: Secondary | ICD-10-CM | POA: Diagnosis not present

## 2016-10-16 ENCOUNTER — Other Ambulatory Visit: Payer: Managed Care, Other (non HMO)

## 2016-10-22 ENCOUNTER — Telehealth: Payer: Self-pay | Admitting: Medical

## 2016-10-22 NOTE — Telephone Encounter (Signed)
Pt's wife called and stated that at last appt Jose Briggs and pt discussed a referral for adult adhd. He would like that referral sent to Dr[facility] Santa Lighterafaell Calabria [facility] [place]. The phone number is [phone]. Pt can be reached at [phone].

## 2016-10-22 NOTE — Telephone Encounter (Signed)
Please make the referral

## 2016-10-24 ENCOUNTER — Telehealth: Payer: Self-pay | Admitting: Family Medicine

## 2016-10-24 NOTE — Telephone Encounter (Signed)
Pt called and ready to start meds for focus as you talked about.  Please mail to pt he is 3 hours away to [address]  [place], [place]  [phone]

## 2016-10-25 ENCOUNTER — Other Ambulatory Visit: Payer: Self-pay | Admitting: Medical

## 2016-10-25 MED ORDER — AMPHETAMINE-DEXTROAMPHETAMINE 10 MG PO TABS: 10.0000 mg | ORAL_TABLET | Freq: Every day | ORAL | 0 refills | Status: DC

## 2016-10-25 NOTE — Telephone Encounter (Signed)
Mail initial prescription for adderall once daily in the morning and lets see how he does on this.  Will need some type of f/u on this in a month  (Diana - given that [facility] does telemedicine, can we not get paid for telephone visit going forward.  [place] and probably can't be seen here in a months.  So options would be for him to see provider on campus or in that community or return here.  However, if I can bill for phone encounter to review and discuss, we don't have to send him out.  [facility] down town has people come in and the doc is on a screen remotely and they bill for this.  Its the same in my eyes.  Any thoughts? )

## 2016-10-25 NOTE — Telephone Encounter (Signed)
Sent referral 

## 2016-10-30 NOTE — Telephone Encounter (Signed)
[name], please advise on the billing questions.

## 2016-10-31 ENCOUNTER — Telehealth: Payer: Self-pay

## 2016-10-31 NOTE — Telephone Encounter (Signed)
Pt called to let us know that pt [facility] or [facility], request [facility] stating they do not treat ADHD. Just FYI. Awaiting records from [place]. Trixie Rude/RLB

## 2016-11-01 NOTE — Telephone Encounter (Signed)
No records found on [facility] at [facility] either. FYI

## 2017-02-24 ENCOUNTER — Other Ambulatory Visit (INDEPENDENT_AMBULATORY_CARE_PROVIDER_SITE_OTHER): Payer: 59

## 2017-02-24 DIAGNOSIS — Z23 Encounter for immunization: Secondary | ICD-10-CM | POA: Diagnosis not present

## 2017-05-01 ENCOUNTER — Telehealth: Payer: Self-pay

## 2017-05-01 NOTE — Telephone Encounter (Signed)
Records mailed to [facility] per records request. Trixie Rude/RLB

## 2018-03-12 DIAGNOSIS — M9903 Segmental and somatic dysfunction of lumbar region: Secondary | ICD-10-CM | POA: Diagnosis not present

## 2018-03-12 DIAGNOSIS — M542 Cervicalgia: Secondary | ICD-10-CM | POA: Diagnosis not present

## 2018-03-12 DIAGNOSIS — M9901 Segmental and somatic dysfunction of cervical region: Secondary | ICD-10-CM | POA: Diagnosis not present

## 2018-03-12 DIAGNOSIS — M9902 Segmental and somatic dysfunction of thoracic region: Secondary | ICD-10-CM | POA: Diagnosis not present

## 2018-03-19 DIAGNOSIS — M9902 Segmental and somatic dysfunction of thoracic region: Secondary | ICD-10-CM | POA: Diagnosis not present

## 2018-03-19 DIAGNOSIS — M9901 Segmental and somatic dysfunction of cervical region: Secondary | ICD-10-CM | POA: Diagnosis not present

## 2018-03-19 DIAGNOSIS — M9903 Segmental and somatic dysfunction of lumbar region: Secondary | ICD-10-CM | POA: Diagnosis not present

## 2018-03-19 DIAGNOSIS — M542 Cervicalgia: Secondary | ICD-10-CM | POA: Diagnosis not present

## 2018-03-26 DIAGNOSIS — M9901 Segmental and somatic dysfunction of cervical region: Secondary | ICD-10-CM | POA: Diagnosis not present

## 2018-03-26 DIAGNOSIS — R109 Unspecified abdominal pain: Secondary | ICD-10-CM | POA: Diagnosis not present

## 2018-03-26 DIAGNOSIS — Z0000 Encounter for general adult medical examination without abnormal findings: Secondary | ICD-10-CM | POA: Diagnosis not present

## 2018-03-26 DIAGNOSIS — N50812 Left testicular pain: Secondary | ICD-10-CM | POA: Diagnosis not present

## 2018-03-26 DIAGNOSIS — R1084 Generalized abdominal pain: Secondary | ICD-10-CM | POA: Diagnosis not present

## 2018-03-26 DIAGNOSIS — M9903 Segmental and somatic dysfunction of lumbar region: Secondary | ICD-10-CM | POA: Diagnosis not present

## 2018-03-26 DIAGNOSIS — M542 Cervicalgia: Secondary | ICD-10-CM | POA: Diagnosis not present

## 2018-03-26 DIAGNOSIS — M9902 Segmental and somatic dysfunction of thoracic region: Secondary | ICD-10-CM | POA: Diagnosis not present

## 2018-03-27 DIAGNOSIS — Z0000 Encounter for general adult medical examination without abnormal findings: Secondary | ICD-10-CM | POA: Diagnosis not present

## 2018-03-27 DIAGNOSIS — Z1329 Encounter for screening for other suspected endocrine disorder: Secondary | ICD-10-CM | POA: Diagnosis not present

## 2018-04-02 DIAGNOSIS — M542 Cervicalgia: Secondary | ICD-10-CM | POA: Diagnosis not present

## 2018-04-02 DIAGNOSIS — L02222 Furuncle of back [any part, except buttock]: Secondary | ICD-10-CM | POA: Diagnosis not present

## 2018-04-02 DIAGNOSIS — M9902 Segmental and somatic dysfunction of thoracic region: Secondary | ICD-10-CM | POA: Diagnosis not present

## 2018-04-02 DIAGNOSIS — L02223 Furuncle of chest wall: Secondary | ICD-10-CM | POA: Diagnosis not present

## 2018-04-02 DIAGNOSIS — M9903 Segmental and somatic dysfunction of lumbar region: Secondary | ICD-10-CM | POA: Diagnosis not present

## 2018-04-02 DIAGNOSIS — L0232 Furuncle of buttock: Secondary | ICD-10-CM | POA: Diagnosis not present

## 2018-04-02 DIAGNOSIS — M9901 Segmental and somatic dysfunction of cervical region: Secondary | ICD-10-CM | POA: Diagnosis not present

## 2018-04-02 DIAGNOSIS — L02221 Furuncle of abdominal wall: Secondary | ICD-10-CM | POA: Diagnosis not present

## 2018-05-14 DIAGNOSIS — M542 Cervicalgia: Secondary | ICD-10-CM | POA: Diagnosis not present

## 2018-05-14 DIAGNOSIS — M9903 Segmental and somatic dysfunction of lumbar region: Secondary | ICD-10-CM | POA: Diagnosis not present

## 2018-05-14 DIAGNOSIS — M9902 Segmental and somatic dysfunction of thoracic region: Secondary | ICD-10-CM | POA: Diagnosis not present

## 2018-05-14 DIAGNOSIS — M9901 Segmental and somatic dysfunction of cervical region: Secondary | ICD-10-CM | POA: Diagnosis not present

## 2018-05-21 DIAGNOSIS — M542 Cervicalgia: Secondary | ICD-10-CM | POA: Diagnosis not present

## 2018-05-21 DIAGNOSIS — M9903 Segmental and somatic dysfunction of lumbar region: Secondary | ICD-10-CM | POA: Diagnosis not present

## 2018-05-21 DIAGNOSIS — M9901 Segmental and somatic dysfunction of cervical region: Secondary | ICD-10-CM | POA: Diagnosis not present

## 2018-05-21 DIAGNOSIS — M9902 Segmental and somatic dysfunction of thoracic region: Secondary | ICD-10-CM | POA: Diagnosis not present

## 2018-05-28 DIAGNOSIS — M9902 Segmental and somatic dysfunction of thoracic region: Secondary | ICD-10-CM | POA: Diagnosis not present

## 2018-05-28 DIAGNOSIS — M542 Cervicalgia: Secondary | ICD-10-CM | POA: Diagnosis not present

## 2018-05-28 DIAGNOSIS — M9903 Segmental and somatic dysfunction of lumbar region: Secondary | ICD-10-CM | POA: Diagnosis not present

## 2018-05-28 DIAGNOSIS — M9901 Segmental and somatic dysfunction of cervical region: Secondary | ICD-10-CM | POA: Diagnosis not present

## 2018-06-11 DIAGNOSIS — M9901 Segmental and somatic dysfunction of cervical region: Secondary | ICD-10-CM | POA: Diagnosis not present

## 2018-06-11 DIAGNOSIS — M9903 Segmental and somatic dysfunction of lumbar region: Secondary | ICD-10-CM | POA: Diagnosis not present

## 2018-06-11 DIAGNOSIS — M542 Cervicalgia: Secondary | ICD-10-CM | POA: Diagnosis not present

## 2018-06-11 DIAGNOSIS — M9902 Segmental and somatic dysfunction of thoracic region: Secondary | ICD-10-CM | POA: Diagnosis not present

## 2018-06-18 DIAGNOSIS — M9901 Segmental and somatic dysfunction of cervical region: Secondary | ICD-10-CM | POA: Diagnosis not present

## 2018-06-18 DIAGNOSIS — M9903 Segmental and somatic dysfunction of lumbar region: Secondary | ICD-10-CM | POA: Diagnosis not present

## 2018-06-18 DIAGNOSIS — M542 Cervicalgia: Secondary | ICD-10-CM | POA: Diagnosis not present

## 2018-06-18 DIAGNOSIS — M9902 Segmental and somatic dysfunction of thoracic region: Secondary | ICD-10-CM | POA: Diagnosis not present

## 2018-06-25 DIAGNOSIS — M9901 Segmental and somatic dysfunction of cervical region: Secondary | ICD-10-CM | POA: Diagnosis not present

## 2018-06-25 DIAGNOSIS — M9902 Segmental and somatic dysfunction of thoracic region: Secondary | ICD-10-CM | POA: Diagnosis not present

## 2018-06-25 DIAGNOSIS — M542 Cervicalgia: Secondary | ICD-10-CM | POA: Diagnosis not present

## 2018-06-25 DIAGNOSIS — M9903 Segmental and somatic dysfunction of lumbar region: Secondary | ICD-10-CM | POA: Diagnosis not present

## 2018-07-02 DIAGNOSIS — M9902 Segmental and somatic dysfunction of thoracic region: Secondary | ICD-10-CM | POA: Diagnosis not present

## 2018-07-02 DIAGNOSIS — M9903 Segmental and somatic dysfunction of lumbar region: Secondary | ICD-10-CM | POA: Diagnosis not present

## 2018-07-02 DIAGNOSIS — M9901 Segmental and somatic dysfunction of cervical region: Secondary | ICD-10-CM | POA: Diagnosis not present

## 2018-07-02 DIAGNOSIS — M542 Cervicalgia: Secondary | ICD-10-CM | POA: Diagnosis not present

## 2018-07-15 DIAGNOSIS — M9901 Segmental and somatic dysfunction of cervical region: Secondary | ICD-10-CM | POA: Diagnosis not present

## 2018-07-15 DIAGNOSIS — Z23 Encounter for immunization: Secondary | ICD-10-CM | POA: Diagnosis not present

## 2018-07-15 DIAGNOSIS — M542 Cervicalgia: Secondary | ICD-10-CM | POA: Diagnosis not present

## 2018-07-15 DIAGNOSIS — M9903 Segmental and somatic dysfunction of lumbar region: Secondary | ICD-10-CM | POA: Diagnosis not present

## 2018-07-15 DIAGNOSIS — M9902 Segmental and somatic dysfunction of thoracic region: Secondary | ICD-10-CM | POA: Diagnosis not present

## 2018-07-28 DIAGNOSIS — M9901 Segmental and somatic dysfunction of cervical region: Secondary | ICD-10-CM | POA: Diagnosis not present

## 2018-07-28 DIAGNOSIS — M542 Cervicalgia: Secondary | ICD-10-CM | POA: Diagnosis not present

## 2018-07-28 DIAGNOSIS — M9903 Segmental and somatic dysfunction of lumbar region: Secondary | ICD-10-CM | POA: Diagnosis not present

## 2018-07-28 DIAGNOSIS — M9902 Segmental and somatic dysfunction of thoracic region: Secondary | ICD-10-CM | POA: Diagnosis not present

## 2018-08-04 DIAGNOSIS — M9903 Segmental and somatic dysfunction of lumbar region: Secondary | ICD-10-CM | POA: Diagnosis not present

## 2018-08-04 DIAGNOSIS — M9902 Segmental and somatic dysfunction of thoracic region: Secondary | ICD-10-CM | POA: Diagnosis not present

## 2018-08-04 DIAGNOSIS — M542 Cervicalgia: Secondary | ICD-10-CM | POA: Diagnosis not present

## 2018-08-04 DIAGNOSIS — M9901 Segmental and somatic dysfunction of cervical region: Secondary | ICD-10-CM | POA: Diagnosis not present

## 2018-08-12 DIAGNOSIS — M9903 Segmental and somatic dysfunction of lumbar region: Secondary | ICD-10-CM | POA: Diagnosis not present

## 2018-08-12 DIAGNOSIS — M542 Cervicalgia: Secondary | ICD-10-CM | POA: Diagnosis not present

## 2018-08-12 DIAGNOSIS — M9902 Segmental and somatic dysfunction of thoracic region: Secondary | ICD-10-CM | POA: Diagnosis not present

## 2018-08-12 DIAGNOSIS — M9901 Segmental and somatic dysfunction of cervical region: Secondary | ICD-10-CM | POA: Diagnosis not present

## 2018-08-20 DIAGNOSIS — M9903 Segmental and somatic dysfunction of lumbar region: Secondary | ICD-10-CM | POA: Diagnosis not present

## 2018-08-20 DIAGNOSIS — R5383 Other fatigue: Secondary | ICD-10-CM | POA: Diagnosis not present

## 2018-08-20 DIAGNOSIS — R69 Illness, unspecified: Secondary | ICD-10-CM | POA: Diagnosis not present

## 2018-08-20 DIAGNOSIS — Z111 Encounter for screening for respiratory tuberculosis: Secondary | ICD-10-CM | POA: Diagnosis not present

## 2018-08-20 DIAGNOSIS — M9901 Segmental and somatic dysfunction of cervical region: Secondary | ICD-10-CM | POA: Diagnosis not present

## 2018-08-20 DIAGNOSIS — M9902 Segmental and somatic dysfunction of thoracic region: Secondary | ICD-10-CM | POA: Diagnosis not present

## 2018-08-20 DIAGNOSIS — R531 Weakness: Secondary | ICD-10-CM | POA: Diagnosis not present

## 2018-08-20 DIAGNOSIS — M542 Cervicalgia: Secondary | ICD-10-CM | POA: Diagnosis not present

## 2018-08-26 DIAGNOSIS — M9901 Segmental and somatic dysfunction of cervical region: Secondary | ICD-10-CM | POA: Diagnosis not present

## 2018-08-26 DIAGNOSIS — M9902 Segmental and somatic dysfunction of thoracic region: Secondary | ICD-10-CM | POA: Diagnosis not present

## 2018-08-26 DIAGNOSIS — M542 Cervicalgia: Secondary | ICD-10-CM | POA: Diagnosis not present

## 2018-08-26 DIAGNOSIS — M9903 Segmental and somatic dysfunction of lumbar region: Secondary | ICD-10-CM | POA: Diagnosis not present

## 2018-09-02 DIAGNOSIS — M542 Cervicalgia: Secondary | ICD-10-CM | POA: Diagnosis not present

## 2018-09-02 DIAGNOSIS — M9903 Segmental and somatic dysfunction of lumbar region: Secondary | ICD-10-CM | POA: Diagnosis not present

## 2018-09-02 DIAGNOSIS — M9901 Segmental and somatic dysfunction of cervical region: Secondary | ICD-10-CM | POA: Diagnosis not present

## 2018-09-02 DIAGNOSIS — M9902 Segmental and somatic dysfunction of thoracic region: Secondary | ICD-10-CM | POA: Diagnosis not present

## 2018-09-09 DIAGNOSIS — M9902 Segmental and somatic dysfunction of thoracic region: Secondary | ICD-10-CM | POA: Diagnosis not present

## 2018-09-09 DIAGNOSIS — M542 Cervicalgia: Secondary | ICD-10-CM | POA: Diagnosis not present

## 2018-09-09 DIAGNOSIS — M9903 Segmental and somatic dysfunction of lumbar region: Secondary | ICD-10-CM | POA: Diagnosis not present

## 2018-09-09 DIAGNOSIS — M9901 Segmental and somatic dysfunction of cervical region: Secondary | ICD-10-CM | POA: Diagnosis not present

## 2018-09-10 DIAGNOSIS — N469 Male infertility, unspecified: Secondary | ICD-10-CM | POA: Diagnosis not present

## 2018-09-16 DIAGNOSIS — M9901 Segmental and somatic dysfunction of cervical region: Secondary | ICD-10-CM | POA: Diagnosis not present

## 2018-09-16 DIAGNOSIS — M9902 Segmental and somatic dysfunction of thoracic region: Secondary | ICD-10-CM | POA: Diagnosis not present

## 2018-09-16 DIAGNOSIS — Z139 Encounter for screening, unspecified: Secondary | ICD-10-CM | POA: Diagnosis not present

## 2018-09-16 DIAGNOSIS — N469 Male infertility, unspecified: Secondary | ICD-10-CM | POA: Diagnosis not present

## 2018-09-16 DIAGNOSIS — M542 Cervicalgia: Secondary | ICD-10-CM | POA: Diagnosis not present

## 2018-09-16 DIAGNOSIS — M9903 Segmental and somatic dysfunction of lumbar region: Secondary | ICD-10-CM | POA: Diagnosis not present

## 2018-09-17 DIAGNOSIS — Z79899 Other long term (current) drug therapy: Secondary | ICD-10-CM | POA: Diagnosis not present

## 2018-09-17 DIAGNOSIS — Z13220 Encounter for screening for lipoid disorders: Secondary | ICD-10-CM | POA: Diagnosis not present

## 2018-09-17 DIAGNOSIS — E291 Testicular hypofunction: Secondary | ICD-10-CM | POA: Diagnosis not present

## 2018-09-23 DIAGNOSIS — M9901 Segmental and somatic dysfunction of cervical region: Secondary | ICD-10-CM | POA: Diagnosis not present

## 2018-09-23 DIAGNOSIS — M9902 Segmental and somatic dysfunction of thoracic region: Secondary | ICD-10-CM | POA: Diagnosis not present

## 2018-09-23 DIAGNOSIS — M542 Cervicalgia: Secondary | ICD-10-CM | POA: Diagnosis not present

## 2018-09-23 DIAGNOSIS — M9903 Segmental and somatic dysfunction of lumbar region: Secondary | ICD-10-CM | POA: Diagnosis not present

## 2018-10-06 DIAGNOSIS — M9901 Segmental and somatic dysfunction of cervical region: Secondary | ICD-10-CM | POA: Diagnosis not present

## 2018-10-06 DIAGNOSIS — M9903 Segmental and somatic dysfunction of lumbar region: Secondary | ICD-10-CM | POA: Diagnosis not present

## 2018-10-06 DIAGNOSIS — M9902 Segmental and somatic dysfunction of thoracic region: Secondary | ICD-10-CM | POA: Diagnosis not present

## 2018-10-06 DIAGNOSIS — M542 Cervicalgia: Secondary | ICD-10-CM | POA: Diagnosis not present

## 2018-10-21 DIAGNOSIS — Z302 Encounter for sterilization: Secondary | ICD-10-CM | POA: Diagnosis not present

## 2018-10-21 DIAGNOSIS — E291 Testicular hypofunction: Secondary | ICD-10-CM | POA: Diagnosis not present

## 2019-04-08 DIAGNOSIS — L02821 Furuncle of head [any part, except face]: Secondary | ICD-10-CM | POA: Diagnosis not present

## 2019-04-08 DIAGNOSIS — L0212 Furuncle of neck: Secondary | ICD-10-CM | POA: Diagnosis not present

## 2019-04-08 DIAGNOSIS — L02221 Furuncle of abdominal wall: Secondary | ICD-10-CM | POA: Diagnosis not present

## 2019-04-08 DIAGNOSIS — L02223 Furuncle of chest wall: Secondary | ICD-10-CM | POA: Diagnosis not present

## 2019-04-08 DIAGNOSIS — L02424 Furuncle of left upper limb: Secondary | ICD-10-CM | POA: Diagnosis not present

## 2019-04-08 DIAGNOSIS — L02222 Furuncle of back [any part, except buttock]: Secondary | ICD-10-CM | POA: Diagnosis not present

## 2019-04-21 DIAGNOSIS — E291 Testicular hypofunction: Secondary | ICD-10-CM | POA: Diagnosis not present

## 2019-05-20 DIAGNOSIS — L02424 Furuncle of left upper limb: Secondary | ICD-10-CM | POA: Diagnosis not present

## 2019-05-20 DIAGNOSIS — L02221 Furuncle of abdominal wall: Secondary | ICD-10-CM | POA: Diagnosis not present

## 2019-05-20 DIAGNOSIS — L02222 Furuncle of back [any part, except buttock]: Secondary | ICD-10-CM | POA: Diagnosis not present

## 2019-05-20 DIAGNOSIS — L02423 Furuncle of right upper limb: Secondary | ICD-10-CM | POA: Diagnosis not present

## 2019-07-15 DIAGNOSIS — L738 Other specified follicular disorders: Secondary | ICD-10-CM | POA: Diagnosis not present

## 2019-07-15 DIAGNOSIS — L02423 Furuncle of right upper limb: Secondary | ICD-10-CM | POA: Diagnosis not present

## 2019-07-15 DIAGNOSIS — L02222 Furuncle of back [any part, except buttock]: Secondary | ICD-10-CM | POA: Diagnosis not present

## 2019-07-15 DIAGNOSIS — L02221 Furuncle of abdominal wall: Secondary | ICD-10-CM | POA: Diagnosis not present

## 2019-07-15 DIAGNOSIS — L02424 Furuncle of left upper limb: Secondary | ICD-10-CM | POA: Diagnosis not present

## 2019-07-15 DIAGNOSIS — L02223 Furuncle of chest wall: Secondary | ICD-10-CM | POA: Diagnosis not present

## 2019-07-15 DIAGNOSIS — L0212 Furuncle of neck: Secondary | ICD-10-CM | POA: Diagnosis not present

## 2019-07-15 DIAGNOSIS — Z79899 Other long term (current) drug therapy: Secondary | ICD-10-CM | POA: Diagnosis not present

## 2019-07-15 DIAGNOSIS — L02821 Furuncle of head [any part, except face]: Secondary | ICD-10-CM | POA: Diagnosis not present

## 2019-07-15 DIAGNOSIS — L700 Acne vulgaris: Secondary | ICD-10-CM | POA: Diagnosis not present

## 2019-08-10 DIAGNOSIS — Z79899 Other long term (current) drug therapy: Secondary | ICD-10-CM | POA: Diagnosis not present

## 2019-08-16 DIAGNOSIS — Z79899 Other long term (current) drug therapy: Secondary | ICD-10-CM | POA: Diagnosis not present

## 2019-08-16 DIAGNOSIS — L02424 Furuncle of left upper limb: Secondary | ICD-10-CM | POA: Diagnosis not present

## 2019-08-16 DIAGNOSIS — L700 Acne vulgaris: Secondary | ICD-10-CM | POA: Diagnosis not present

## 2019-08-16 DIAGNOSIS — L02439 Carbuncle of limb, unspecified: Secondary | ICD-10-CM | POA: Diagnosis not present

## 2019-09-09 DIAGNOSIS — Z79899 Other long term (current) drug therapy: Secondary | ICD-10-CM | POA: Diagnosis not present

## 2019-09-13 DIAGNOSIS — Z79899 Other long term (current) drug therapy: Secondary | ICD-10-CM | POA: Diagnosis not present

## 2019-09-13 DIAGNOSIS — L700 Acne vulgaris: Secondary | ICD-10-CM | POA: Diagnosis not present

## 2019-10-07 DIAGNOSIS — Z79899 Other long term (current) drug therapy: Secondary | ICD-10-CM | POA: Diagnosis not present

## 2019-10-08 HISTORY — PX: VASECTOMY: SHX75

## 2019-10-12 DIAGNOSIS — L700 Acne vulgaris: Secondary | ICD-10-CM | POA: Diagnosis not present

## 2019-10-12 DIAGNOSIS — Z79899 Other long term (current) drug therapy: Secondary | ICD-10-CM | POA: Diagnosis not present

## 2019-11-05 DIAGNOSIS — L700 Acne vulgaris: Secondary | ICD-10-CM | POA: Diagnosis not present

## 2019-11-05 DIAGNOSIS — Z79899 Other long term (current) drug therapy: Secondary | ICD-10-CM | POA: Diagnosis not present

## 2019-11-09 DIAGNOSIS — L700 Acne vulgaris: Secondary | ICD-10-CM | POA: Diagnosis not present

## 2019-11-09 DIAGNOSIS — L810 Postinflammatory hyperpigmentation: Secondary | ICD-10-CM | POA: Diagnosis not present

## 2019-11-09 DIAGNOSIS — Z79899 Other long term (current) drug therapy: Secondary | ICD-10-CM | POA: Diagnosis not present

## 2019-11-09 DIAGNOSIS — E291 Testicular hypofunction: Secondary | ICD-10-CM | POA: Diagnosis not present

## 2019-12-02 DIAGNOSIS — Z79899 Other long term (current) drug therapy: Secondary | ICD-10-CM | POA: Diagnosis not present

## 2019-12-07 DIAGNOSIS — L810 Postinflammatory hyperpigmentation: Secondary | ICD-10-CM | POA: Diagnosis not present

## 2019-12-07 DIAGNOSIS — Z79899 Other long term (current) drug therapy: Secondary | ICD-10-CM | POA: Diagnosis not present

## 2019-12-07 DIAGNOSIS — L905 Scar conditions and fibrosis of skin: Secondary | ICD-10-CM | POA: Diagnosis not present

## 2019-12-07 DIAGNOSIS — L700 Acne vulgaris: Secondary | ICD-10-CM | POA: Diagnosis not present

## 2020-01-04 DIAGNOSIS — M7918 Myalgia, other site: Secondary | ICD-10-CM | POA: Diagnosis not present

## 2020-01-04 DIAGNOSIS — M545 Low back pain: Secondary | ICD-10-CM | POA: Diagnosis not present

## 2020-01-04 DIAGNOSIS — M9905 Segmental and somatic dysfunction of pelvic region: Secondary | ICD-10-CM | POA: Diagnosis not present

## 2020-01-04 DIAGNOSIS — M9903 Segmental and somatic dysfunction of lumbar region: Secondary | ICD-10-CM | POA: Diagnosis not present

## 2020-01-04 DIAGNOSIS — M9904 Segmental and somatic dysfunction of sacral region: Secondary | ICD-10-CM | POA: Diagnosis not present

## 2020-01-14 DIAGNOSIS — M545 Low back pain: Secondary | ICD-10-CM | POA: Diagnosis not present

## 2020-01-14 DIAGNOSIS — M7918 Myalgia, other site: Secondary | ICD-10-CM | POA: Diagnosis not present

## 2020-01-14 DIAGNOSIS — M9904 Segmental and somatic dysfunction of sacral region: Secondary | ICD-10-CM | POA: Diagnosis not present

## 2020-01-14 DIAGNOSIS — M9905 Segmental and somatic dysfunction of pelvic region: Secondary | ICD-10-CM | POA: Diagnosis not present

## 2020-01-14 DIAGNOSIS — M9903 Segmental and somatic dysfunction of lumbar region: Secondary | ICD-10-CM | POA: Diagnosis not present

## 2020-01-21 DIAGNOSIS — M545 Low back pain: Secondary | ICD-10-CM | POA: Diagnosis not present

## 2020-01-21 DIAGNOSIS — M9904 Segmental and somatic dysfunction of sacral region: Secondary | ICD-10-CM | POA: Diagnosis not present

## 2020-01-21 DIAGNOSIS — M9903 Segmental and somatic dysfunction of lumbar region: Secondary | ICD-10-CM | POA: Diagnosis not present

## 2020-01-21 DIAGNOSIS — M9905 Segmental and somatic dysfunction of pelvic region: Secondary | ICD-10-CM | POA: Diagnosis not present

## 2020-01-21 DIAGNOSIS — M7918 Myalgia, other site: Secondary | ICD-10-CM | POA: Diagnosis not present

## 2020-01-24 DIAGNOSIS — Z125 Encounter for screening for malignant neoplasm of prostate: Secondary | ICD-10-CM | POA: Diagnosis not present

## 2020-01-24 DIAGNOSIS — Z3009 Encounter for other general counseling and advice on contraception: Secondary | ICD-10-CM | POA: Diagnosis not present

## 2020-01-24 DIAGNOSIS — R5383 Other fatigue: Secondary | ICD-10-CM | POA: Diagnosis not present

## 2020-02-04 DIAGNOSIS — M7918 Myalgia, other site: Secondary | ICD-10-CM | POA: Diagnosis not present

## 2020-02-04 DIAGNOSIS — M545 Low back pain: Secondary | ICD-10-CM | POA: Diagnosis not present

## 2020-02-04 DIAGNOSIS — M9905 Segmental and somatic dysfunction of pelvic region: Secondary | ICD-10-CM | POA: Diagnosis not present

## 2020-02-04 DIAGNOSIS — M9904 Segmental and somatic dysfunction of sacral region: Secondary | ICD-10-CM | POA: Diagnosis not present

## 2020-02-04 DIAGNOSIS — M9903 Segmental and somatic dysfunction of lumbar region: Secondary | ICD-10-CM | POA: Diagnosis not present

## 2020-02-10 DIAGNOSIS — M7918 Myalgia, other site: Secondary | ICD-10-CM | POA: Diagnosis not present

## 2020-02-10 DIAGNOSIS — M9905 Segmental and somatic dysfunction of pelvic region: Secondary | ICD-10-CM | POA: Diagnosis not present

## 2020-02-10 DIAGNOSIS — M545 Low back pain: Secondary | ICD-10-CM | POA: Diagnosis not present

## 2020-02-10 DIAGNOSIS — M9903 Segmental and somatic dysfunction of lumbar region: Secondary | ICD-10-CM | POA: Diagnosis not present

## 2020-02-10 DIAGNOSIS — M9904 Segmental and somatic dysfunction of sacral region: Secondary | ICD-10-CM | POA: Diagnosis not present

## 2020-02-18 DIAGNOSIS — M7918 Myalgia, other site: Secondary | ICD-10-CM | POA: Diagnosis not present

## 2020-02-18 DIAGNOSIS — M9905 Segmental and somatic dysfunction of pelvic region: Secondary | ICD-10-CM | POA: Diagnosis not present

## 2020-02-18 DIAGNOSIS — M9904 Segmental and somatic dysfunction of sacral region: Secondary | ICD-10-CM | POA: Diagnosis not present

## 2020-02-18 DIAGNOSIS — M9903 Segmental and somatic dysfunction of lumbar region: Secondary | ICD-10-CM | POA: Diagnosis not present

## 2020-02-18 DIAGNOSIS — M545 Low back pain: Secondary | ICD-10-CM | POA: Diagnosis not present

## 2020-02-21 DIAGNOSIS — E291 Testicular hypofunction: Secondary | ICD-10-CM | POA: Diagnosis not present

## 2020-02-24 DIAGNOSIS — R5383 Other fatigue: Secondary | ICD-10-CM | POA: Insufficient documentation

## 2020-02-24 DIAGNOSIS — M7918 Myalgia, other site: Secondary | ICD-10-CM | POA: Diagnosis not present

## 2020-02-24 DIAGNOSIS — M545 Low back pain: Secondary | ICD-10-CM | POA: Diagnosis not present

## 2020-02-24 DIAGNOSIS — M9904 Segmental and somatic dysfunction of sacral region: Secondary | ICD-10-CM | POA: Diagnosis not present

## 2020-02-24 DIAGNOSIS — M9905 Segmental and somatic dysfunction of pelvic region: Secondary | ICD-10-CM | POA: Diagnosis not present

## 2020-02-24 DIAGNOSIS — M9903 Segmental and somatic dysfunction of lumbar region: Secondary | ICD-10-CM | POA: Diagnosis not present

## 2020-02-24 DIAGNOSIS — R6882 Decreased libido: Secondary | ICD-10-CM | POA: Insufficient documentation

## 2020-03-13 DIAGNOSIS — Z302 Encounter for sterilization: Secondary | ICD-10-CM | POA: Diagnosis not present

## 2020-03-29 DIAGNOSIS — M9903 Segmental and somatic dysfunction of lumbar region: Secondary | ICD-10-CM | POA: Diagnosis not present

## 2020-03-29 DIAGNOSIS — M545 Low back pain: Secondary | ICD-10-CM | POA: Diagnosis not present

## 2020-03-29 DIAGNOSIS — M9904 Segmental and somatic dysfunction of sacral region: Secondary | ICD-10-CM | POA: Diagnosis not present

## 2020-03-29 DIAGNOSIS — M9905 Segmental and somatic dysfunction of pelvic region: Secondary | ICD-10-CM | POA: Diagnosis not present

## 2020-03-29 DIAGNOSIS — M7918 Myalgia, other site: Secondary | ICD-10-CM | POA: Diagnosis not present

## 2020-04-04 DIAGNOSIS — M7918 Myalgia, other site: Secondary | ICD-10-CM | POA: Diagnosis not present

## 2020-04-04 DIAGNOSIS — M545 Low back pain: Secondary | ICD-10-CM | POA: Diagnosis not present

## 2020-04-04 DIAGNOSIS — M9905 Segmental and somatic dysfunction of pelvic region: Secondary | ICD-10-CM | POA: Diagnosis not present

## 2020-04-04 DIAGNOSIS — M9903 Segmental and somatic dysfunction of lumbar region: Secondary | ICD-10-CM | POA: Diagnosis not present

## 2020-04-04 DIAGNOSIS — M9904 Segmental and somatic dysfunction of sacral region: Secondary | ICD-10-CM | POA: Diagnosis not present

## 2020-04-11 DIAGNOSIS — M9904 Segmental and somatic dysfunction of sacral region: Secondary | ICD-10-CM | POA: Diagnosis not present

## 2020-04-11 DIAGNOSIS — M9905 Segmental and somatic dysfunction of pelvic region: Secondary | ICD-10-CM | POA: Diagnosis not present

## 2020-04-11 DIAGNOSIS — M9903 Segmental and somatic dysfunction of lumbar region: Secondary | ICD-10-CM | POA: Diagnosis not present

## 2020-04-11 DIAGNOSIS — M545 Low back pain: Secondary | ICD-10-CM | POA: Diagnosis not present

## 2020-04-11 DIAGNOSIS — M7918 Myalgia, other site: Secondary | ICD-10-CM | POA: Diagnosis not present

## 2020-04-25 DIAGNOSIS — M7918 Myalgia, other site: Secondary | ICD-10-CM | POA: Diagnosis not present

## 2020-04-25 DIAGNOSIS — M9903 Segmental and somatic dysfunction of lumbar region: Secondary | ICD-10-CM | POA: Diagnosis not present

## 2020-04-25 DIAGNOSIS — M545 Low back pain: Secondary | ICD-10-CM | POA: Diagnosis not present

## 2020-04-25 DIAGNOSIS — M9905 Segmental and somatic dysfunction of pelvic region: Secondary | ICD-10-CM | POA: Diagnosis not present

## 2020-04-25 DIAGNOSIS — M9904 Segmental and somatic dysfunction of sacral region: Secondary | ICD-10-CM | POA: Diagnosis not present

## 2020-05-12 DIAGNOSIS — M545 Low back pain: Secondary | ICD-10-CM | POA: Diagnosis not present

## 2020-05-12 DIAGNOSIS — M9904 Segmental and somatic dysfunction of sacral region: Secondary | ICD-10-CM | POA: Diagnosis not present

## 2020-05-12 DIAGNOSIS — M7918 Myalgia, other site: Secondary | ICD-10-CM | POA: Diagnosis not present

## 2020-05-12 DIAGNOSIS — M9905 Segmental and somatic dysfunction of pelvic region: Secondary | ICD-10-CM | POA: Diagnosis not present

## 2020-05-12 DIAGNOSIS — M9903 Segmental and somatic dysfunction of lumbar region: Secondary | ICD-10-CM | POA: Diagnosis not present

## 2020-08-28 DIAGNOSIS — E291 Testicular hypofunction: Secondary | ICD-10-CM | POA: Diagnosis not present

## 2020-08-29 ENCOUNTER — Encounter: Payer: Self-pay | Admitting: Medical

## 2020-09-22 DIAGNOSIS — M9904 Segmental and somatic dysfunction of sacral region: Secondary | ICD-10-CM | POA: Diagnosis not present

## 2020-09-22 DIAGNOSIS — M9903 Segmental and somatic dysfunction of lumbar region: Secondary | ICD-10-CM | POA: Diagnosis not present

## 2020-09-22 DIAGNOSIS — M7918 Myalgia, other site: Secondary | ICD-10-CM | POA: Diagnosis not present

## 2020-09-22 DIAGNOSIS — M545 Low back pain, unspecified: Secondary | ICD-10-CM | POA: Diagnosis not present

## 2020-09-22 DIAGNOSIS — M9905 Segmental and somatic dysfunction of pelvic region: Secondary | ICD-10-CM | POA: Diagnosis not present

## 2020-10-05 DIAGNOSIS — M7918 Myalgia, other site: Secondary | ICD-10-CM | POA: Diagnosis not present

## 2020-10-05 DIAGNOSIS — M9905 Segmental and somatic dysfunction of pelvic region: Secondary | ICD-10-CM | POA: Diagnosis not present

## 2020-10-05 DIAGNOSIS — M5459 Other low back pain: Secondary | ICD-10-CM | POA: Diagnosis not present

## 2020-10-05 DIAGNOSIS — M9903 Segmental and somatic dysfunction of lumbar region: Secondary | ICD-10-CM | POA: Diagnosis not present

## 2020-10-05 DIAGNOSIS — M9904 Segmental and somatic dysfunction of sacral region: Secondary | ICD-10-CM | POA: Diagnosis not present

## 2020-10-19 DIAGNOSIS — M9904 Segmental and somatic dysfunction of sacral region: Secondary | ICD-10-CM | POA: Diagnosis not present

## 2020-10-19 DIAGNOSIS — M9903 Segmental and somatic dysfunction of lumbar region: Secondary | ICD-10-CM | POA: Diagnosis not present

## 2020-10-19 DIAGNOSIS — M9905 Segmental and somatic dysfunction of pelvic region: Secondary | ICD-10-CM | POA: Diagnosis not present

## 2020-10-19 DIAGNOSIS — M5459 Other low back pain: Secondary | ICD-10-CM | POA: Diagnosis not present

## 2020-10-19 DIAGNOSIS — M7918 Myalgia, other site: Secondary | ICD-10-CM | POA: Diagnosis not present

## 2020-10-31 DIAGNOSIS — M9903 Segmental and somatic dysfunction of lumbar region: Secondary | ICD-10-CM | POA: Diagnosis not present

## 2020-10-31 DIAGNOSIS — M5459 Other low back pain: Secondary | ICD-10-CM | POA: Diagnosis not present

## 2020-10-31 DIAGNOSIS — M9904 Segmental and somatic dysfunction of sacral region: Secondary | ICD-10-CM | POA: Diagnosis not present

## 2020-10-31 DIAGNOSIS — M9905 Segmental and somatic dysfunction of pelvic region: Secondary | ICD-10-CM | POA: Diagnosis not present

## 2020-10-31 DIAGNOSIS — M7918 Myalgia, other site: Secondary | ICD-10-CM | POA: Diagnosis not present

## 2020-11-14 DIAGNOSIS — M9904 Segmental and somatic dysfunction of sacral region: Secondary | ICD-10-CM | POA: Diagnosis not present

## 2020-11-14 DIAGNOSIS — M9903 Segmental and somatic dysfunction of lumbar region: Secondary | ICD-10-CM | POA: Diagnosis not present

## 2020-11-14 DIAGNOSIS — M9905 Segmental and somatic dysfunction of pelvic region: Secondary | ICD-10-CM | POA: Diagnosis not present

## 2020-11-14 DIAGNOSIS — M7918 Myalgia, other site: Secondary | ICD-10-CM | POA: Diagnosis not present

## 2020-11-14 DIAGNOSIS — M5459 Other low back pain: Secondary | ICD-10-CM | POA: Diagnosis not present

## 2020-11-30 DIAGNOSIS — M9903 Segmental and somatic dysfunction of lumbar region: Secondary | ICD-10-CM | POA: Diagnosis not present

## 2020-11-30 DIAGNOSIS — M9904 Segmental and somatic dysfunction of sacral region: Secondary | ICD-10-CM | POA: Diagnosis not present

## 2020-11-30 DIAGNOSIS — M9905 Segmental and somatic dysfunction of pelvic region: Secondary | ICD-10-CM | POA: Diagnosis not present

## 2020-11-30 DIAGNOSIS — M5459 Other low back pain: Secondary | ICD-10-CM | POA: Diagnosis not present

## 2020-11-30 DIAGNOSIS — M7918 Myalgia, other site: Secondary | ICD-10-CM | POA: Diagnosis not present

## 2020-12-19 DIAGNOSIS — M9903 Segmental and somatic dysfunction of lumbar region: Secondary | ICD-10-CM | POA: Diagnosis not present

## 2020-12-19 DIAGNOSIS — M7918 Myalgia, other site: Secondary | ICD-10-CM | POA: Diagnosis not present

## 2020-12-19 DIAGNOSIS — M9904 Segmental and somatic dysfunction of sacral region: Secondary | ICD-10-CM | POA: Diagnosis not present

## 2020-12-19 DIAGNOSIS — M5459 Other low back pain: Secondary | ICD-10-CM | POA: Diagnosis not present

## 2020-12-19 DIAGNOSIS — M9905 Segmental and somatic dysfunction of pelvic region: Secondary | ICD-10-CM | POA: Diagnosis not present

## 2021-01-03 DIAGNOSIS — M7918 Myalgia, other site: Secondary | ICD-10-CM | POA: Diagnosis not present

## 2021-01-03 DIAGNOSIS — M5459 Other low back pain: Secondary | ICD-10-CM | POA: Diagnosis not present

## 2021-01-03 DIAGNOSIS — M9903 Segmental and somatic dysfunction of lumbar region: Secondary | ICD-10-CM | POA: Diagnosis not present

## 2021-01-03 DIAGNOSIS — M9905 Segmental and somatic dysfunction of pelvic region: Secondary | ICD-10-CM | POA: Diagnosis not present

## 2021-01-03 DIAGNOSIS — M9904 Segmental and somatic dysfunction of sacral region: Secondary | ICD-10-CM | POA: Diagnosis not present

## 2021-01-16 DIAGNOSIS — M9903 Segmental and somatic dysfunction of lumbar region: Secondary | ICD-10-CM | POA: Diagnosis not present

## 2021-01-16 DIAGNOSIS — M7918 Myalgia, other site: Secondary | ICD-10-CM | POA: Diagnosis not present

## 2021-01-16 DIAGNOSIS — M9904 Segmental and somatic dysfunction of sacral region: Secondary | ICD-10-CM | POA: Diagnosis not present

## 2021-01-16 DIAGNOSIS — M5459 Other low back pain: Secondary | ICD-10-CM | POA: Diagnosis not present

## 2021-01-16 DIAGNOSIS — M9905 Segmental and somatic dysfunction of pelvic region: Secondary | ICD-10-CM | POA: Diagnosis not present

## 2021-02-02 DIAGNOSIS — M7918 Myalgia, other site: Secondary | ICD-10-CM | POA: Diagnosis not present

## 2021-02-02 DIAGNOSIS — M9903 Segmental and somatic dysfunction of lumbar region: Secondary | ICD-10-CM | POA: Diagnosis not present

## 2021-02-02 DIAGNOSIS — M9905 Segmental and somatic dysfunction of pelvic region: Secondary | ICD-10-CM | POA: Diagnosis not present

## 2021-02-02 DIAGNOSIS — M5459 Other low back pain: Secondary | ICD-10-CM | POA: Diagnosis not present

## 2021-02-02 DIAGNOSIS — M9904 Segmental and somatic dysfunction of sacral region: Secondary | ICD-10-CM | POA: Diagnosis not present

## 2021-04-02 ENCOUNTER — Other Ambulatory Visit: Payer: Self-pay

## 2021-04-02 ENCOUNTER — Encounter: Payer: Self-pay | Admitting: Family Medicine

## 2021-04-02 ENCOUNTER — Ambulatory Visit (INDEPENDENT_AMBULATORY_CARE_PROVIDER_SITE_OTHER): Payer: 59 | Admitting: Family Medicine

## 2021-04-02 VITALS — BP 128/80 | HR 83 | Temp 98.0°F | Ht 72.25 in | Wt 231.0 lb

## 2021-04-02 DIAGNOSIS — Z79899 Other long term (current) drug therapy: Secondary | ICD-10-CM | POA: Diagnosis not present

## 2021-04-02 DIAGNOSIS — Z0000 Encounter for general adult medical examination without abnormal findings: Secondary | ICD-10-CM | POA: Diagnosis not present

## 2021-04-02 DIAGNOSIS — Z114 Encounter for screening for human immunodeficiency virus [HIV]: Secondary | ICD-10-CM

## 2021-04-02 DIAGNOSIS — Z1159 Encounter for screening for other viral diseases: Secondary | ICD-10-CM | POA: Diagnosis not present

## 2021-04-02 DIAGNOSIS — E669 Obesity, unspecified: Secondary | ICD-10-CM | POA: Diagnosis not present

## 2021-04-02 DIAGNOSIS — Z79890 Hormone replacement therapy (postmenopausal): Secondary | ICD-10-CM

## 2021-04-02 DIAGNOSIS — Z131 Encounter for screening for diabetes mellitus: Secondary | ICD-10-CM

## 2021-04-02 DIAGNOSIS — Z13220 Encounter for screening for lipoid disorders: Secondary | ICD-10-CM | POA: Diagnosis not present

## 2021-04-02 DIAGNOSIS — R69 Illness, unspecified: Secondary | ICD-10-CM | POA: Diagnosis not present

## 2021-04-02 DIAGNOSIS — Z7689 Persons encountering health services in other specified circumstances: Secondary | ICD-10-CM

## 2021-04-02 DIAGNOSIS — E291 Testicular hypofunction: Secondary | ICD-10-CM

## 2021-04-02 NOTE — Progress Notes (Signed)
Patient ID: Jose Briggs, male  DOB: 19-Jan-1986, 35 y.o.   MRN: [id number] Patient Care Team    Relationship Specialty Notifications Start End  Ma Hillock, DO PCP - General Family Medicine  04/02/21   Robbie Louis, MD Referring Physician Urology  04/02/21     Chief Complaint  Patient presents with   Establish Care    No complaints/ no med refills/ last CPE 2020; pt is not fasting   Annual Exam    Subjective:  Jose Briggs is a 35 y.o.  male present for new patient establishment/CPE. All past medical history, surgical history, allergies, family history, immunizations, medications and social history were updated in the electronic medical record today. All recent labs, ED visits and hospitalizations within the last year were reviewed.  Health maintenance:  Colonoscopy: Family history present in grandparent later in life.  Routine screening at 45 recommended.   Immunizations: tdap UTD 2015, Influenza UTD 2021 (encouraged yearly), covid x3 Infectious disease screening: HIV and Hep C collected today PSA: No results found for: PSA-completed at urology today.  For testosterone therapy monitoring.  No family history. Assistive device: none Oxygen OVA:[facility] Patient has a Dental home. Hospitalizations/ED visits:reviewed  Depression screen [facility] 2/9 04/02/2021 08/09/2016  Decreased Interest 0 0  Down, Depressed, Hopeless 0 0  PHQ - 2 Score 0 0   No flowsheet data found.     Fall Risk  08/09/2016  Falls in the past year? No   Immunization History  Administered Date(s) Administered   Hepatitis B, adult 08/14/2016, 09/16/2016, 02/24/2017   Hepatitis B, ped/adol 08/14/2016, 09/16/2016, 02/24/2017   Influenza Split 08/01/2020   PFIZER(Purple Top)SARS-COV-2 Vaccination 12/25/2019, 01/15/2020, 07/28/2020   Tdap 02/04/2014    No results found.  Past Medical History:  Diagnosis Date   Acne vulgaris 09/15/2015   ADHD    diagnosed in childhood   Anxiety 2009   white  coat anxiety, test taking anxiety, no prior medications   Chronic back pain    due to prior football injury, uses OTC ibuprofen   Generalized anxiety disorder 03/21/2016   History of seizure    age 25yo; History of med-induced seizure once only due to Ritalin; no seizures since   Hypogonadism in male    Under the care of urology-supplementing testosterone   Insomnia 09/15/2015   Allergies  Allergen Reactions   Ritalin [Methylphenidate Hcl]     seizure   Past Surgical History:  Procedure Laterality Date   LYMPH NODE DISSECTION  10/08/2003   due to enlarged node, cervical/neck, non-cancerous   SHOULDER SURGERY  10/07/2006   left RTC and labral repair   VASECTOMY  2021   WISDOM TOOTH EXTRACTION     Family History  Problem Relation Age of Onset   Hypertension Mother    Hyperlipidemia Mother    Heart disease Mother    Diabetes Mother    Depression Mother    Asthma Mother    Hypothyroidism Mother    Breast cancer Mother 83   Hypertension Sister    Colon cancer Maternal Grandfather        Unknown age   Stroke Neg Hx    Social History   Social History Narrative   Marital status/children/pets: Married.  5 people live within the home.   Education/employment: Masters degree, works as a Surveyor, minerals in the ICU in [employer].   Safety:      -Wears a bicycle helmet riding a bike: Yes     -  smoke alarm in the home:Yes     - wears seatbelt: Yes     - Feels safe in their relationships: Yes       Allergies as of 04/02/2021       Reactions   Ritalin [methylphenidate Hcl]    seizure        Medication List        Accurate as of April 02, 2021  5:19 PM. If you have any questions, ask your nurse or doctor.          STOP taking these medications    amphetamine-dextroamphetamine 10 MG tablet Commonly known as: Adderall Stopped by: Howard Pouch, DO   B-D 3CC LUER-LOK SYR 18GX1-1/2 18G X 1-1/2" 3 ML Misc Generic drug: SYRINGE-NEEDLE (DISP) 3 ML Stopped  by: Howard Pouch, DO   chlorhexidine 0.12 % solution Commonly known as: PERIDEX Stopped by: Howard Pouch, DO   citalopram 20 MG tablet Commonly known as: CELEXA Stopped by: Howard Pouch, DO   HYPODERMIC NEEDLE 21GX1-1/2" 21G X 1-1/2" Misc Stopped by: Howard Pouch, DO       TAKE these medications    testosterone cypionate 100 MG/ML injection Commonly known as: DEPOTESTOTERONE CYPIONATE Inject into the muscle. What changed: Another medication with the same name was removed. Continue taking this medication, and follow the directions you see here. Changed by: Howard Pouch, DO        All past medical history, surgical history, allergies, family history, immunizations andmedications were updated in the EMR today and reviewed under the history and medication portions of their EMR.    No results found for this or any previous visit (from the past 2160 hour(s)).  No results found.   ROS: 14 pt review of systems performed and negative (unless mentioned in an HPI)  Objective: BP 128/80   Pulse 83   Temp 98 F (36.7 C) (Oral)   Ht 6' 0.25" (1.835 m)   Wt 231 lb (104.8 kg)   SpO2 100%   BMI 31.11 kg/m  Gen: Afebrile. No acute distress. Nontoxic in appearance, well-developed, well-nourished,  pleasant male.  Physically fit. HENT: AT. [place]. Bilateral TM visualized and normal in appearance, normal external auditory canal. MMM, no oral lesions, adequate dentition. Bilateral nares within normal limits. Throat without erythema, ulcerations or exudates.  No cough on exam, no hoarseness on exam. Eyes:Pupils Equal Round Reactive to light, Extraocular movements intact,  Conjunctiva without redness, discharge or icterus. Neck/lymp/endocrine: Supple, no lymphadenopathy, no thyromegaly CV: RRR no murmur, no edema, +2/4 P posterior tibialis pulses.  Chest: CTAB, no wheeze, rhonchi or crackles.  Normal respiratory effort.  Good air movement. Abd: Soft.  Flat. NTND. BS present.  No masses  palpated. No hepatosplenomegaly. No rebound tenderness or guarding. Skin: No rashes, purpura or petechiae. Warm and well-perfused. Skin intact. Neuro/Msk:  Normal gait. PERLA. EOMi. Alert. Oriented x3.  Cranial nerves II through XII intact. Muscle strength 5/5 upper/lower extremity. DTRs equal bilaterally. Psych: Normal affect, dress and demeanor. Normal speech. Normal thought content and judgment.   Assessment/plan: Jose Briggs is a 35 y.o. male present for new est. /cpr Establishing care with new doctor, encounter for Hypogonadism in male/Long-term current use of testosterone replacement therapy Follows with urology every 6 months.  On testosterone therapy. Diabetes mellitus screening - Hemoglobin A1c Encounter for long-term (current) use of medications - Comp Met (CMET) Need for hepatitis C screening test - Hepatitis C Antibody Encounter for screening for HIV - HIV antibody (with reflex) Lipid screening -  Lipid panel Routine general medical examination at a health care facility Colonoscopy: Family history present in grandparent later in life.  Routine screening at 45 recommended.   Immunizations: tdap UTD 2015, Influenza UTD 2021 (encouraged yearly), covid x3 Infectious disease screening: HIV and Hep C collected today Patient was encouraged to exercise greater than 150 minutes a week. Patient was encouraged to choose a diet filled with fresh fruits and vegetables, and lean meats. AVS provided to patient today for education/recommendation on gender specific health and safety maintenance. Return in about 1 year (around 04/02/2022) for CPE (30 min).  Orders Placed This Encounter  Procedures   Lipid panel   Hemoglobin A1c   Comp Met (CMET)   Hepatitis C Antibody   HIV antibody (with reflex)    No orders of the defined types were placed in this encounter.  Referral Orders  No referral(s) requested today     Note is dictated utilizing voice recognition software. Although  note has been proof read prior to signing, occasional typographical errors still can be missed. If any questions arise, please do not hesitate to call for verification.  Electronically signed by: Howard Pouch, DO [place]

## 2021-04-02 NOTE — Patient Instructions (Addendum)
Great to meet  you today!  Health Maintenance, Male Adopting a healthy lifestyle and getting preventive care are important in promoting health and wellness. Ask your health care provider about: The right schedule for you to have regular tests and exams. Things you can do on your own to prevent diseases and keep yourself healthy. What should I know about diet, weight, and exercise? Eat a healthy diet  Eat a diet that includes plenty of vegetables, fruits, low-fat dairy products, and lean protein. Do not eat a lot of foods that are high in solid fats, added sugars, or sodium.  Maintain a healthy weight Body mass index (BMI) is a measurement that can be used to identify possible weight problems. It estimates body fat based on height and weight. Your health care provider can help determine your BMI and help you achieve or maintain ahealthy weight. Get regular exercise Get regular exercise. This is one of the most important things you can do for your health. Most adults should: Exercise for at least 150 minutes each week. The exercise should increase your heart rate and make you sweat (moderate-intensity exercise). Do strengthening exercises at least twice a week. This is in addition to the moderate-intensity exercise. Spend less time sitting. Even light physical activity can be beneficial. Watch cholesterol and blood lipids Have your blood tested for lipids and cholesterol at 33 years of age, then havethis test every 5 years. You may need to have your cholesterol levels checked more often if: Your lipid or cholesterol levels are high. You are older than 33 years of age. You are at high risk for heart disease. What should I know about cancer screening? Many types of cancers can be detected early and may often be prevented. Depending on your health history and family history, you may need to have cancer screening at various ages. This may include screening for: Colorectal cancer. Prostate  cancer. Skin cancer. Lung cancer. What should I know about heart disease, diabetes, and high blood pressure? Blood pressure and heart disease High blood pressure causes heart disease and increases the risk of stroke. This is more likely to develop in people who have high blood pressure readings, are of African descent, or are overweight. Talk with your health care provider about your target blood pressure readings. Have your blood pressure checked: Every 3-5 years if you are 60-45 years of age. Every year if you are 63 years old or older. If you are between the ages of 12 and 40 and are a current or former smoker, ask your health care provider if you should have a one-time screening for abdominal aortic aneurysm (AAA). Diabetes Have regular diabetes screenings. This checks your fasting blood sugar level. Have the screening done: Once every three years after age 13 if you are at a normal weight and have a low risk for diabetes. More often and at a younger age if you are overweight or have a high risk for diabetes. What should I know about preventing infection? Hepatitis B If you have a higher risk for hepatitis B, you should be screened for this virus. Talk with your health care provider to find out if you are at risk forhepatitis B infection. Hepatitis C Blood testing is recommended for: Everyone born from [birth year] through 1965. Anyone with known risk factors for hepatitis C. Sexually transmitted infections (STIs) You should be screened each year for STIs, including gonorrhea and chlamydia, if: You are sexually active and are younger than 33 years of age. You  are older than 33 years of age and your health care provider tells you that you are at risk for this type of infection. Your sexual activity has changed since you were last screened, and you are at increased risk for chlamydia or gonorrhea. Ask your health care provider if you are at risk. Ask your health care provider about whether you  are at high risk for HIV. Your health care provider may recommend a prescription medicine to help prevent HIV infection. If you choose to take medicine to prevent HIV, you should first get tested for HIV. You should then be tested every 3 months for as long as you are taking the medicine. Follow these instructions at home: Lifestyle Do not use any products that contain nicotine or tobacco, such as cigarettes, e-cigarettes, and chewing tobacco. If you need help quitting, ask your health care provider. Do not use street drugs. Do not share needles. Ask your health care provider for help if you need support or information about quitting drugs. Alcohol use Do not drink alcohol if your health care provider tells you not to drink. If you drink alcohol: Limit how much you have to 0-2 drinks a day. Be aware of how much alcohol is in your drink. In the [place] one drink equals one 12 oz bottle of beer (355 mL), one 5 oz glass of wine (148 mL), or one 1 oz glass of hard liquor (44 mL). General instructions Schedule regular health, dental, and eye exams. Stay current with your vaccines. Tell your health care provider if: You often feel depressed. You have ever been abused or do not feel safe at home. Summary Adopting a healthy lifestyle and getting preventive care are important in promoting health and wellness. Follow your health care provider's instructions about healthy diet, exercising, and getting tested or screened for diseases. Follow your health care provider's instructions on monitoring your cholesterol and blood pressure. This information is not intended to replace advice given to you by your health care provider. Make sure you discuss any questions you have with your healthcare provider. Document Revised: 09/16/2018 Document Reviewed: 09/16/2018 Elsevier Patient Education  2022 [employer] Inc.    Please help [place] help you:  We are honored you have chosen [facility] for your Primary  Care home. Below you will find basic instructions that you may need to access in the future. Please help [place] help you by reading the instructions, which cover many of the frequent questions we experience.   Prescription refills and request:  -In order to allow more efficient response time, please call your pharmacy for all refills. They will forward the request electronically to [place]. This allows for the quickest possible response. Request left on a nurse line can take longer to refill, since these are checked as time allows between office patients and other phone calls.  - refill request can take up to 3-5 working days to complete.  - If request is sent electronically and request is appropiate, it is usually completed in 1-2 business days.  - all patients will need to be seen routinely for all chronic medical conditions requiring prescription medications (see follow-up below). If you are overdue for follow up on your condition, you will be asked to make an appointment and we will call in enough medication to cover you until your appointment (up to 30 days).  - all controlled substances will require a face to face visit to request/refill.  - if you desire your prescriptions to go through  a new pharmacy, and have an active script at original pharmacy, you will need to call your pharmacy and have scripts transferred to new pharmacy. This is completed between the pharmacy locations and not by your provider.    Results: Our office handles many outgoing and incoming calls daily. If we have not contacted you within 1 week about your results, please check your mychart to see if there is a message first and if not, then contact our office.  In helping with this matter, you help decrease call volume, and therefore allow [place] to be able to respond to patients needs more efficiently.  We will always attempt to call you with results,  normal or abnormal. However, if we are unable to reach you we will send a message in  your my chart with results.   Acute office visits (sick visit):  An acute visit is intended for a new problem and are scheduled in shorter time slots to allow schedule openings for patients with new problems. This is the appropriate visit to discuss a new problem. Problems will not be addressed by phone call or Echart message. Appointment is needed if requesting treatment. In order to provide you with excellent quality medical care with proper time for you to explain your problem, have an exam and receive treatment with instructions, these appointments should be limited to one new problem per visit. If you experience a new problem, in which you desire to be addressed, please make an acute office visit, we save openings on the schedule to accommodate you. Please do not save your new problem for any other type of visit, let us take care of it properly and quickly for you.   Follow up visits:  Depending on your condition(s) your provider will need to see you routinely in order to provide you with quality care and prescribe medication(s). Most chronic conditions (Example: hypertension, Diabetes, depression/anxiety... etc), require visits a couple times a year. Your provider will instruct you on proper follow up for your personal medical conditions and history. Please make certain to make follow up appointments for your condition as instructed. Failing to do so could result in lapse in your medication treatment/refills. If you request a refill, and are overdue to be seen on a condition, we will always provide you with a 30 day script (once) to allow you time to schedule.     Yearly physical (well visit):  - Adults are recommended to be seen yearly for physicals. Check with your insurance and date of your last physical, most insurances require one calendar year between physicals. Physicals include all preventive health topics, screenings, medical exam and labs that are appropriate for gender/age and history.  You may have fasting labs needed at this visit. This is a well visit (not a sick visit), new problems should not be covered during this visit (see acute visit).  - Pediatric patients are seen more frequently when they are younger. Your provider will advise you on well child visit timing that is appropriate for your their age. - This is not a medicare wellness visit. Medicare wellness exams do not have an exam portion to the visit. Some medicare companies allow for a physical, some do not allow a yearly physical. If your medicare allows a yearly physical you can schedule the medicare wellness with our nurse Selena Batten and have your physical with your provider after, on the same day. Please check with insurance for your full benefits.   Late Policy/No Shows:  - all  new patients should arrive 15-30 minutes earlier than appointment to allow [place] time  to  obtain all personal demographics,  insurance information and for you to complete office paperwork. - All established patients should arrive 10-15 minutes earlier than appointment time to update all information and be checked in .  - In our best efforts to run on time, if you are late for your appointment you will be asked to either reschedule or if able, we will work you back into the schedule. There will be a wait time to work you back in the schedule,  depending on availability.  - If you are unable to make it to your appointment as scheduled, please call 24 hours ahead of time to allow [place] to fill the time slot with someone else who needs to be seen. If you do not cancel your appointment ahead of time, you may be charged a no show fee.

## 2021-04-03 LAB — HEMOGLOBIN A1C
Hgb A1c MFr Bld: 5.2 % of total Hgb (ref <5.7)
Mean Plasma Glucose: 103 mg/dL
eAG (mmol/L): 5.7 mmol/L

## 2021-04-03 LAB — COMPREHENSIVE METABOLIC PANEL
AG Ratio: 1.4 (calc) (ref 1.0–2.5)
ALT: 24 U/L (ref 9–46)
AST: 23 U/L (ref 10–40)
Albumin: 4.5 g/dL (ref 3.6–5.1)
Alkaline phosphatase (APISO): 55 U/L (ref 36–130)
BUN: 20 mg/dL (ref 7–25)
CO2: 26 mmol/L (ref 20–32)
Calcium: 9.2 mg/dL (ref 8.6–10.3)
Chloride: 100 mmol/L (ref 98–110)
Creat: 1.32 mg/dL (ref 0.60–1.35)
Globulin: 3.2 g/dL (calc) (ref 1.9–3.7)
Glucose, Bld: 73 mg/dL (ref 65–99)
Potassium: 4.3 mmol/L (ref 3.5–5.3)
Sodium: 137 mmol/L (ref 135–146)
Total Bilirubin: 0.5 mg/dL (ref 0.2–1.2)
Total Protein: 7.7 g/dL (ref 6.1–8.1)

## 2021-04-03 LAB — HEPATITIS C ANTIBODY
Hepatitis C Ab: NONREACTIVE
SIGNAL TO CUT-OFF: 0.04 (ref <1.00)

## 2021-04-03 LAB — LIPID PANEL
Cholesterol: 182 mg/dL (ref <200)
HDL: 46 mg/dL (ref >=40)
LDL Cholesterol (Calc): 116 mg/dL — ABNORMAL HIGH
Non-HDL Cholesterol (Calc): 136 mg/dL — ABNORMAL HIGH (ref <130)
Total CHOL/HDL Ratio: 4 (calc) (ref <5.0)
Triglycerides: 98 mg/dL (ref <150)

## 2021-04-03 LAB — HIV ANTIBODY (ROUTINE TESTING W REFLEX): HIV 1&2 Ab, 4th Generation: NONREACTIVE

## 2021-05-08 ENCOUNTER — Telehealth (INDEPENDENT_AMBULATORY_CARE_PROVIDER_SITE_OTHER): Payer: 59 | Admitting: Internal Medicine

## 2021-05-08 ENCOUNTER — Encounter: Payer: Self-pay | Admitting: Internal Medicine

## 2021-05-08 VITALS — Ht 72.25 in | Wt 230.0 lb

## 2021-05-08 DIAGNOSIS — U071 COVID-19: Secondary | ICD-10-CM | POA: Diagnosis not present

## 2021-05-08 MED ORDER — MOLNUPIRAVIR EUA 200MG CAPSULE: 4.0000 | ORAL_CAPSULE | Freq: Two times a day (BID) | ORAL | 0 refills | Status: AC

## 2021-05-08 NOTE — Progress Notes (Signed)
Subjective:    Patient ID: Jose Briggs, male    DOB: 1986-01-10, 35 y.o.   MRN: [id number]  DOS:  05/08/2021 Type of visit - description: Virtual Visit via Video Note  I connected with the above patient  by a video enabled telemedicine application and verified that I am speaking with the correct person using two identifiers.   THIS ENCOUNTER IS A VIRTUAL VISIT DUE TO COVID-19 - PATIENT WAS NOT SEEN IN THE OFFICE. PATIENT HAS CONSENTED TO VIRTUAL VISIT / TELEMEDICINE VISIT   Location of patient: home  Location of provider: office  Persons participating in the virtual visit: patient, provider   I discussed the limitations of evaluation and management by telemedicine and the availability of in person appointments. The patient expressed understanding and agreed to proceed.  Acute Symptoms started very early today at around 4 AM: Fever, aches, nose congestion, headaches. This morning he tested positive for COVID.  His temperature has been as high as 101.0.  It decreases with Tylenol. No nausea or vomiting No chest pain no difficulty breathing No rash Some headache. No cough   Review of Systems See above   Past Medical History:  Diagnosis Date   Acne vulgaris 09/15/2015   ADHD    diagnosed in childhood   Anxiety 2009   white coat anxiety, test taking anxiety, no prior medications   Chronic back pain    due to prior football injury, uses OTC ibuprofen   Generalized anxiety disorder 03/21/2016   History of seizure    age 52yo; History of med-induced seizure once only due to Ritalin; no seizures since   Hypogonadism in male    Under the care of urology-supplementing testosterone   Insomnia 09/15/2015    Past Surgical History:  Procedure Laterality Date   LYMPH NODE DISSECTION  10/08/2003   due to enlarged node, cervical/neck, non-cancerous   SHOULDER SURGERY  10/07/2006   left RTC and labral repair   VASECTOMY  2021   WISDOM TOOTH EXTRACTION      Allergies as of  05/08/2021       Reactions   Ritalin [methylphenidate Hcl]    seizure        Medication List        Accurate as of May 08, 2021 11:59 PM. If you have any questions, ask your nurse or doctor.          molnupiravir EUA 200 mg Caps Take 4 capsules (800 mg total) by mouth 2 (two) times daily for 5 days. Started by: Willow Ora, MD   testosterone cypionate 100 MG/ML injection Commonly known as: DEPOTESTOTERONE CYPIONATE Inject into the muscle.           Objective:   Physical Exam Ht 6' 0.25" (1.835 m)   Wt 230 lb (104.3 kg)   BMI 30.98 kg/m  This is a virtual video visit, alert oriented x3, in no apparent distress. No vital signs available    Assessment    COVID-19. The patient is 35 years old, he is critical care physician assistant. Symptoms a started approximately 12 hours ago.  He tested positive this morning. He has 3 COVID vaccines. We agreed on the following: Oral meds: Molnupiravir vs paxlovid, internal data from the hospital indicates better results with molnupiravit thus will rx it. Also recommend rest, fluids, OTCs for cough and congestion. 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 Call if not gradually better     I discussed the assessment and  treatment plan with the patient. The patient was provided an opportunity to ask questions and all were answered. The patient agreed with the plan and demonstrated an understanding of the instructions.   The patient was advised to call back or seek an in-person evaluation if the symptoms worsen or if the condition fails to improve as anticipated.

## 2021-06-22 DIAGNOSIS — M9903 Segmental and somatic dysfunction of lumbar region: Secondary | ICD-10-CM | POA: Diagnosis not present

## 2021-06-22 DIAGNOSIS — M9905 Segmental and somatic dysfunction of pelvic region: Secondary | ICD-10-CM | POA: Diagnosis not present

## 2021-06-22 DIAGNOSIS — M545 Low back pain, unspecified: Secondary | ICD-10-CM | POA: Diagnosis not present

## 2021-06-22 DIAGNOSIS — M9904 Segmental and somatic dysfunction of sacral region: Secondary | ICD-10-CM | POA: Diagnosis not present

## 2021-06-22 DIAGNOSIS — M7918 Myalgia, other site: Secondary | ICD-10-CM | POA: Diagnosis not present

## 2021-07-06 DIAGNOSIS — M9905 Segmental and somatic dysfunction of pelvic region: Secondary | ICD-10-CM | POA: Diagnosis not present

## 2021-07-06 DIAGNOSIS — M7918 Myalgia, other site: Secondary | ICD-10-CM | POA: Diagnosis not present

## 2021-07-06 DIAGNOSIS — M9904 Segmental and somatic dysfunction of sacral region: Secondary | ICD-10-CM | POA: Diagnosis not present

## 2021-07-06 DIAGNOSIS — M545 Low back pain, unspecified: Secondary | ICD-10-CM | POA: Diagnosis not present

## 2021-07-06 DIAGNOSIS — M9903 Segmental and somatic dysfunction of lumbar region: Secondary | ICD-10-CM | POA: Diagnosis not present

## 2021-07-20 DIAGNOSIS — M9903 Segmental and somatic dysfunction of lumbar region: Secondary | ICD-10-CM | POA: Diagnosis not present

## 2021-07-20 DIAGNOSIS — M545 Low back pain, unspecified: Secondary | ICD-10-CM | POA: Diagnosis not present

## 2021-07-20 DIAGNOSIS — M9904 Segmental and somatic dysfunction of sacral region: Secondary | ICD-10-CM | POA: Diagnosis not present

## 2021-07-20 DIAGNOSIS — M9905 Segmental and somatic dysfunction of pelvic region: Secondary | ICD-10-CM | POA: Diagnosis not present

## 2021-07-20 DIAGNOSIS — M7918 Myalgia, other site: Secondary | ICD-10-CM | POA: Diagnosis not present

## 2021-08-06 DIAGNOSIS — L739 Follicular disorder, unspecified: Secondary | ICD-10-CM | POA: Diagnosis not present

## 2021-08-06 DIAGNOSIS — Z23 Encounter for immunization: Secondary | ICD-10-CM | POA: Diagnosis not present

## 2021-08-10 DIAGNOSIS — M7918 Myalgia, other site: Secondary | ICD-10-CM | POA: Diagnosis not present

## 2021-08-10 DIAGNOSIS — M545 Low back pain, unspecified: Secondary | ICD-10-CM | POA: Diagnosis not present

## 2021-08-10 DIAGNOSIS — M9905 Segmental and somatic dysfunction of pelvic region: Secondary | ICD-10-CM | POA: Diagnosis not present

## 2021-08-10 DIAGNOSIS — M9903 Segmental and somatic dysfunction of lumbar region: Secondary | ICD-10-CM | POA: Diagnosis not present

## 2021-08-10 DIAGNOSIS — M9904 Segmental and somatic dysfunction of sacral region: Secondary | ICD-10-CM | POA: Diagnosis not present

## 2021-08-29 DIAGNOSIS — M9904 Segmental and somatic dysfunction of sacral region: Secondary | ICD-10-CM | POA: Diagnosis not present

## 2021-08-29 DIAGNOSIS — M9903 Segmental and somatic dysfunction of lumbar region: Secondary | ICD-10-CM | POA: Diagnosis not present

## 2021-08-29 DIAGNOSIS — M545 Low back pain, unspecified: Secondary | ICD-10-CM | POA: Diagnosis not present

## 2021-08-29 DIAGNOSIS — M9905 Segmental and somatic dysfunction of pelvic region: Secondary | ICD-10-CM | POA: Diagnosis not present

## 2021-08-29 DIAGNOSIS — M7918 Myalgia, other site: Secondary | ICD-10-CM | POA: Diagnosis not present

## 2021-09-05 DIAGNOSIS — L739 Follicular disorder, unspecified: Secondary | ICD-10-CM | POA: Diagnosis not present

## 2021-09-05 DIAGNOSIS — Z23 Encounter for immunization: Secondary | ICD-10-CM | POA: Diagnosis not present

## 2021-09-12 DIAGNOSIS — M545 Low back pain, unspecified: Secondary | ICD-10-CM | POA: Diagnosis not present

## 2021-09-12 DIAGNOSIS — M9904 Segmental and somatic dysfunction of sacral region: Secondary | ICD-10-CM | POA: Diagnosis not present

## 2021-09-12 DIAGNOSIS — M9905 Segmental and somatic dysfunction of pelvic region: Secondary | ICD-10-CM | POA: Diagnosis not present

## 2021-09-12 DIAGNOSIS — M9903 Segmental and somatic dysfunction of lumbar region: Secondary | ICD-10-CM | POA: Diagnosis not present

## 2021-09-12 DIAGNOSIS — M7918 Myalgia, other site: Secondary | ICD-10-CM | POA: Diagnosis not present

## 2021-09-26 DIAGNOSIS — M7918 Myalgia, other site: Secondary | ICD-10-CM | POA: Diagnosis not present

## 2021-09-26 DIAGNOSIS — M9904 Segmental and somatic dysfunction of sacral region: Secondary | ICD-10-CM | POA: Diagnosis not present

## 2021-09-26 DIAGNOSIS — M9905 Segmental and somatic dysfunction of pelvic region: Secondary | ICD-10-CM | POA: Diagnosis not present

## 2021-09-26 DIAGNOSIS — M545 Low back pain, unspecified: Secondary | ICD-10-CM | POA: Diagnosis not present

## 2021-09-26 DIAGNOSIS — M9903 Segmental and somatic dysfunction of lumbar region: Secondary | ICD-10-CM | POA: Diagnosis not present

## 2021-10-10 DIAGNOSIS — M9903 Segmental and somatic dysfunction of lumbar region: Secondary | ICD-10-CM | POA: Diagnosis not present

## 2021-10-10 DIAGNOSIS — M7918 Myalgia, other site: Secondary | ICD-10-CM | POA: Diagnosis not present

## 2021-10-10 DIAGNOSIS — M9904 Segmental and somatic dysfunction of sacral region: Secondary | ICD-10-CM | POA: Diagnosis not present

## 2021-10-10 DIAGNOSIS — M9905 Segmental and somatic dysfunction of pelvic region: Secondary | ICD-10-CM | POA: Diagnosis not present

## 2021-10-10 DIAGNOSIS — J343 Hypertrophy of nasal turbinates: Secondary | ICD-10-CM | POA: Diagnosis not present

## 2021-10-10 DIAGNOSIS — M545 Low back pain, unspecified: Secondary | ICD-10-CM | POA: Diagnosis not present

## 2021-10-10 DIAGNOSIS — J342 Deviated nasal septum: Secondary | ICD-10-CM | POA: Diagnosis not present

## 2021-10-15 DIAGNOSIS — E291 Testicular hypofunction: Secondary | ICD-10-CM | POA: Diagnosis not present

## 2021-10-24 DIAGNOSIS — M545 Low back pain, unspecified: Secondary | ICD-10-CM | POA: Diagnosis not present

## 2021-10-24 DIAGNOSIS — M9905 Segmental and somatic dysfunction of pelvic region: Secondary | ICD-10-CM | POA: Diagnosis not present

## 2021-10-24 DIAGNOSIS — M9904 Segmental and somatic dysfunction of sacral region: Secondary | ICD-10-CM | POA: Diagnosis not present

## 2021-10-24 DIAGNOSIS — M9903 Segmental and somatic dysfunction of lumbar region: Secondary | ICD-10-CM | POA: Diagnosis not present

## 2021-10-24 DIAGNOSIS — M7918 Myalgia, other site: Secondary | ICD-10-CM | POA: Diagnosis not present

## 2021-11-07 DIAGNOSIS — M9903 Segmental and somatic dysfunction of lumbar region: Secondary | ICD-10-CM | POA: Diagnosis not present

## 2021-11-07 DIAGNOSIS — M545 Low back pain, unspecified: Secondary | ICD-10-CM | POA: Diagnosis not present

## 2021-11-07 DIAGNOSIS — M7918 Myalgia, other site: Secondary | ICD-10-CM | POA: Diagnosis not present

## 2021-11-07 DIAGNOSIS — M9904 Segmental and somatic dysfunction of sacral region: Secondary | ICD-10-CM | POA: Diagnosis not present

## 2021-11-07 DIAGNOSIS — M9905 Segmental and somatic dysfunction of pelvic region: Secondary | ICD-10-CM | POA: Diagnosis not present

## 2021-11-22 DIAGNOSIS — J342 Deviated nasal septum: Secondary | ICD-10-CM | POA: Diagnosis not present

## 2021-11-22 DIAGNOSIS — J3489 Other specified disorders of nose and nasal sinuses: Secondary | ICD-10-CM | POA: Diagnosis not present

## 2021-11-22 DIAGNOSIS — J343 Hypertrophy of nasal turbinates: Secondary | ICD-10-CM | POA: Diagnosis not present

## 2021-11-29 DIAGNOSIS — M545 Low back pain, unspecified: Secondary | ICD-10-CM | POA: Diagnosis not present

## 2021-11-29 DIAGNOSIS — M9903 Segmental and somatic dysfunction of lumbar region: Secondary | ICD-10-CM | POA: Diagnosis not present

## 2021-11-29 DIAGNOSIS — M7918 Myalgia, other site: Secondary | ICD-10-CM | POA: Diagnosis not present

## 2021-11-29 DIAGNOSIS — M9905 Segmental and somatic dysfunction of pelvic region: Secondary | ICD-10-CM | POA: Diagnosis not present

## 2021-11-29 DIAGNOSIS — M9904 Segmental and somatic dysfunction of sacral region: Secondary | ICD-10-CM | POA: Diagnosis not present

## 2021-12-13 DIAGNOSIS — M9903 Segmental and somatic dysfunction of lumbar region: Secondary | ICD-10-CM | POA: Diagnosis not present

## 2021-12-13 DIAGNOSIS — M545 Low back pain, unspecified: Secondary | ICD-10-CM | POA: Diagnosis not present

## 2021-12-13 DIAGNOSIS — M9905 Segmental and somatic dysfunction of pelvic region: Secondary | ICD-10-CM | POA: Diagnosis not present

## 2021-12-13 DIAGNOSIS — M9904 Segmental and somatic dysfunction of sacral region: Secondary | ICD-10-CM | POA: Diagnosis not present

## 2021-12-13 DIAGNOSIS — M7918 Myalgia, other site: Secondary | ICD-10-CM | POA: Diagnosis not present

## 2022-01-04 DIAGNOSIS — M9903 Segmental and somatic dysfunction of lumbar region: Secondary | ICD-10-CM | POA: Diagnosis not present

## 2022-01-04 DIAGNOSIS — M9904 Segmental and somatic dysfunction of sacral region: Secondary | ICD-10-CM | POA: Diagnosis not present

## 2022-01-04 DIAGNOSIS — M7918 Myalgia, other site: Secondary | ICD-10-CM | POA: Diagnosis not present

## 2022-01-04 DIAGNOSIS — M9905 Segmental and somatic dysfunction of pelvic region: Secondary | ICD-10-CM | POA: Diagnosis not present

## 2022-01-04 DIAGNOSIS — M545 Low back pain, unspecified: Secondary | ICD-10-CM | POA: Diagnosis not present

## 2022-01-22 DIAGNOSIS — M7918 Myalgia, other site: Secondary | ICD-10-CM | POA: Diagnosis not present

## 2022-01-22 DIAGNOSIS — M9905 Segmental and somatic dysfunction of pelvic region: Secondary | ICD-10-CM | POA: Diagnosis not present

## 2022-01-22 DIAGNOSIS — M545 Low back pain, unspecified: Secondary | ICD-10-CM | POA: Diagnosis not present

## 2022-01-22 DIAGNOSIS — M9904 Segmental and somatic dysfunction of sacral region: Secondary | ICD-10-CM | POA: Diagnosis not present

## 2022-01-22 DIAGNOSIS — M9903 Segmental and somatic dysfunction of lumbar region: Secondary | ICD-10-CM | POA: Diagnosis not present

## 2022-02-04 DIAGNOSIS — M9905 Segmental and somatic dysfunction of pelvic region: Secondary | ICD-10-CM | POA: Diagnosis not present

## 2022-02-04 DIAGNOSIS — M7918 Myalgia, other site: Secondary | ICD-10-CM | POA: Diagnosis not present

## 2022-02-04 DIAGNOSIS — M545 Low back pain, unspecified: Secondary | ICD-10-CM | POA: Diagnosis not present

## 2022-02-04 DIAGNOSIS — M9904 Segmental and somatic dysfunction of sacral region: Secondary | ICD-10-CM | POA: Diagnosis not present

## 2022-02-04 DIAGNOSIS — M9903 Segmental and somatic dysfunction of lumbar region: Secondary | ICD-10-CM | POA: Diagnosis not present

## 2022-02-19 DIAGNOSIS — J343 Hypertrophy of nasal turbinates: Secondary | ICD-10-CM | POA: Diagnosis not present

## 2022-02-19 DIAGNOSIS — J3489 Other specified disorders of nose and nasal sinuses: Secondary | ICD-10-CM | POA: Diagnosis not present

## 2022-02-19 DIAGNOSIS — R0981 Nasal congestion: Secondary | ICD-10-CM | POA: Diagnosis not present

## 2022-02-19 DIAGNOSIS — J342 Deviated nasal septum: Secondary | ICD-10-CM | POA: Diagnosis not present

## 2022-03-01 DIAGNOSIS — M7918 Myalgia, other site: Secondary | ICD-10-CM | POA: Diagnosis not present

## 2022-03-01 DIAGNOSIS — M9905 Segmental and somatic dysfunction of pelvic region: Secondary | ICD-10-CM | POA: Diagnosis not present

## 2022-03-01 DIAGNOSIS — M9903 Segmental and somatic dysfunction of lumbar region: Secondary | ICD-10-CM | POA: Diagnosis not present

## 2022-03-01 DIAGNOSIS — M9904 Segmental and somatic dysfunction of sacral region: Secondary | ICD-10-CM | POA: Diagnosis not present

## 2022-03-01 DIAGNOSIS — M545 Low back pain, unspecified: Secondary | ICD-10-CM | POA: Diagnosis not present

## 2022-03-14 DIAGNOSIS — M9904 Segmental and somatic dysfunction of sacral region: Secondary | ICD-10-CM | POA: Diagnosis not present

## 2022-03-14 DIAGNOSIS — M545 Low back pain, unspecified: Secondary | ICD-10-CM | POA: Diagnosis not present

## 2022-03-14 DIAGNOSIS — M9905 Segmental and somatic dysfunction of pelvic region: Secondary | ICD-10-CM | POA: Diagnosis not present

## 2022-03-14 DIAGNOSIS — M9903 Segmental and somatic dysfunction of lumbar region: Secondary | ICD-10-CM | POA: Diagnosis not present

## 2022-03-14 DIAGNOSIS — M7918 Myalgia, other site: Secondary | ICD-10-CM | POA: Diagnosis not present

## 2022-04-03 ENCOUNTER — Encounter: Payer: 59 | Admitting: Family Medicine

## 2022-04-15 DIAGNOSIS — E291 Testicular hypofunction: Secondary | ICD-10-CM | POA: Diagnosis not present

## 2022-04-15 LAB — CBC: RBC: 5.19 — AB (ref 3.87–5.11)

## 2022-04-15 LAB — CBC AND DIFFERENTIAL
HCT: 49 (ref 41–53)
Hemoglobin: 16.7 (ref 13.5–17.5)
Neutrophils Absolute: 1.1
Platelets: 161 10*3/uL (ref 150–400)
WBC: 3

## 2022-04-15 LAB — PSA: PSA: 0.39

## 2022-04-15 LAB — TESTOSTERONE: Testosterone: 383.1

## 2022-04-19 ENCOUNTER — Ambulatory Visit (INDEPENDENT_AMBULATORY_CARE_PROVIDER_SITE_OTHER): Payer: 59 | Admitting: Family Medicine

## 2022-04-19 ENCOUNTER — Encounter: Payer: Self-pay | Admitting: Family Medicine

## 2022-04-19 VITALS — BP 119/63 | HR 73 | Temp 98.1°F | Ht 72.75 in | Wt 232.0 lb

## 2022-04-19 DIAGNOSIS — E291 Testicular hypofunction: Secondary | ICD-10-CM | POA: Diagnosis not present

## 2022-04-19 DIAGNOSIS — Z79890 Hormone replacement therapy (postmenopausal): Secondary | ICD-10-CM | POA: Diagnosis not present

## 2022-04-19 DIAGNOSIS — Z0000 Encounter for general adult medical examination without abnormal findings: Secondary | ICD-10-CM | POA: Diagnosis not present

## 2022-04-19 LAB — COMPREHENSIVE METABOLIC PANEL
ALT: 29 U/L (ref 0–53)
AST: 27 U/L (ref 0–37)
Albumin: 4.7 g/dL (ref 3.5–5.2)
Alkaline Phosphatase: 46 U/L (ref 39–117)
BUN: 17 mg/dL (ref 6–23)
CO2: 28 mEq/L (ref 19–32)
Calcium: 9.3 mg/dL (ref 8.4–10.5)
Chloride: 103 mEq/L (ref 96–112)
Creatinine, Ser: 1.24 mg/dL (ref 0.40–1.50)
GFR: 76.33 mL/min (ref >60.00)
Glucose, Bld: 92 mg/dL (ref 70–99)
Potassium: 4.6 mEq/L (ref 3.5–5.1)
Sodium: 138 mEq/L (ref 135–145)
Total Bilirubin: 0.6 mg/dL (ref 0.2–1.2)
Total Protein: 7.6 g/dL (ref 6.0–8.3)

## 2022-04-19 LAB — LIPID PANEL
Cholesterol: 199 mg/dL (ref 0–200)
HDL: 50 mg/dL (ref >39.00)
LDL Cholesterol: 133 mg/dL — ABNORMAL HIGH (ref 0–99)
NonHDL: 148.59
Total CHOL/HDL Ratio: 4
Triglycerides: 79 mg/dL (ref 0.0–149.0)
VLDL: 15.8 mg/dL (ref 0.0–40.0)

## 2022-04-19 LAB — HEMOGLOBIN A1C: Hgb A1c MFr Bld: 5.4 % (ref 4.6–6.5)

## 2022-04-19 LAB — TSH: TSH: 1.33 u[IU]/mL (ref 0.35–5.50)

## 2022-04-19 NOTE — Patient Instructions (Signed)
Return in about 1 year (around 04/21/2023) for cpe (20 min).        Great to see you today.  I have refilled the medication(s) we provide.   If labs were collected, we will inform you of lab results once received either by echart message or telephone call.   - echart message- for normal results that have been seen by the patient already.   - telephone call: abnormal results or if patient has not viewed results in their echart.   Health Maintenance, Male Adopting a healthy lifestyle and getting preventive care are important in promoting health and wellness. Ask your health care provider about: The right schedule for you to have regular tests and exams. Things you can do on your own to prevent diseases and keep yourself healthy. What should I know about diet, weight, and exercise? Eat a healthy diet  Eat a diet that includes plenty of vegetables, fruits, low-fat dairy products, and lean protein. Do not eat a lot of foods that are high in solid fats, added sugars, or sodium. Maintain a healthy weight Body mass index (BMI) is a measurement that can be used to identify possible weight problems. It estimates body fat based on height and weight. Your health care provider can help determine your BMI and help you achieve or maintain a healthy weight. Get regular exercise Get regular exercise. This is one of the most important things you can do for your health. Most adults should: Exercise for at least 150 minutes each week. The exercise should increase your heart rate and make you sweat (moderate-intensity exercise). Do strengthening exercises at least twice a week. This is in addition to the moderate-intensity exercise. Spend less time sitting. Even light physical activity can be beneficial. Watch cholesterol and blood lipids Have your blood tested for lipids and cholesterol at 34 years of age, then have this test every 5 years. You may need to have your cholesterol levels checked more often  if: Your lipid or cholesterol levels are high. You are older than 34 years of age. You are at high risk for heart disease. What should I know about cancer screening? Many types of cancers can be detected early and may often be prevented. Depending on your health history and family history, you may need to have cancer screening at various ages. This may include screening for: Colorectal cancer. Prostate cancer. Skin cancer. Lung cancer. What should I know about heart disease, diabetes, and high blood pressure? Blood pressure and heart disease High blood pressure causes heart disease and increases the risk of stroke. This is more likely to develop in people who have high blood pressure readings or are overweight. Talk with your health care provider about your target blood pressure readings. Have your blood pressure checked: Every 3-5 years if you are 7-34 years of age. Every year if you are 33 years old or older. If you are between the ages of 39 and 38 and are a current or former smoker, ask your health care provider if you should have a one-time screening for abdominal aortic aneurysm (AAA). Diabetes Have regular diabetes screenings. This checks your fasting blood sugar level. Have the screening done: Once every three years after age 73 if you are at a normal weight and have a low risk for diabetes. More often and at a younger age if you are overweight or have a high risk for diabetes. What should I know about preventing infection? Hepatitis B If you have a higher risk  for hepatitis B, you should be screened for this virus. Talk with your health care provider to find out if you are at risk for hepatitis B infection. Hepatitis C Blood testing is recommended for: Everyone born from [birth year] through 1965. Anyone with known risk factors for hepatitis C. Sexually transmitted infections (STIs) You should be screened each year for STIs, including gonorrhea and chlamydia, if: You are sexually  active and are younger than 34 years of age. You are older than 34 years of age and your health care provider tells you that you are at risk for this type of infection. Your sexual activity has changed since you were last screened, and you are at increased risk for chlamydia or gonorrhea. Ask your health care provider if you are at risk. Ask your health care provider about whether you are at high risk for HIV. Your health care provider may recommend a prescription medicine to help prevent HIV infection. If you choose to take medicine to prevent HIV, you should first get tested for HIV. You should then be tested every 3 months for as long as you are taking the medicine. Follow these instructions at home: Alcohol use Do not drink alcohol if your health care provider tells you not to drink. If you drink alcohol: Limit how much you have to 0-2 drinks a day. Know how much alcohol is in your drink. In the [place] one drink equals one 12 oz bottle of beer (355 mL), one 5 oz glass of wine (148 mL), or one 1 oz glass of hard liquor (44 mL). Lifestyle Do not use any products that contain nicotine or tobacco. These products include cigarettes, chewing tobacco, and vaping devices, such as e-cigarettes. If you need help quitting, ask your health care provider. Do not use street drugs. Do not share needles. Ask your health care provider for help if you need support or information about quitting drugs. General instructions Schedule regular health, dental, and eye exams. Stay current with your vaccines. Tell your health care provider if: You often feel depressed. You have ever been abused or do not feel safe at home. Summary Adopting a healthy lifestyle and getting preventive care are important in promoting health and wellness. Follow your health care provider's instructions about healthy diet, exercising, and getting tested or screened for diseases. Follow your health care provider's instructions on  monitoring your cholesterol and blood pressure. This information is not intended to replace advice given to you by your health care provider. Make sure you discuss any questions you have with your health care provider. Document Revised: 02/12/2021 Document Reviewed: 02/12/2021 Elsevier Patient Education  [place].

## 2022-04-19 NOTE — Progress Notes (Signed)
Patient ID: Jose Briggs, male  DOB: 04/16/1986, 36 y.o.   MRN: [id number] Patient Care Team    Relationship Specialty Notifications Start End  Natalia Leatherwood, DO PCP - General Family Medicine  04/02/21   Moreen Fowler, MD Referring Physician Urology  04/02/21   Laren Boom, DO Consulting Physician Otolaryngology  04/19/22     Chief Complaint  Patient presents with   Annual Exam    Pt is fasting    Subjective: Jose Briggs is a 36 y.o. male present for CPE. All past medical history, surgical history, allergies, family history, immunizations, medications and social history were updated in the electronic medical record today. All recent labs, ED visits and hospitalizations within the last year were reviewed.  Health maintenance:  Colonoscopy: Family history present in grandparent later in life.  Routine screening at 45 recommended.   Immunizations: tdap UTD 2015, Influenza (encouraged yearly), covid x3 Infectious disease screening: HIV and Hep C completed PSA: - has completed at uro-hrt. Normal. pt was counseled on prostate cancer screenings. No fhx. Assistive device: none Oxygen GYJ:EHUD Patient has a Dental home. Hospitalizations/ED visits: reviewed     04/19/2022    9:42 AM 04/02/2021    1:19 PM 08/09/2016    9:42 AM  Depression screen PHQ 2/9  Decreased Interest 0 0 0  Down, Depressed, Hopeless 0 0 0  PHQ - 2 Score 0 0 0       No data to display                08/09/2016    9:42 AM  Fall Risk   Falls in the past year? No    Immunization History  Administered Date(s) Administered   Hepatitis B, adult 08/14/2016, 09/16/2016, 02/24/2017   Hepatitis B, ped/adol 08/14/2016, 09/16/2016, 02/24/2017   Influenza Split 08/01/2020   Influenza-Unspecified 08/01/2020, 07/16/2021   PFIZER(Purple Top)SARS-COV-2 Vaccination 12/25/2019, 01/15/2020, 07/28/2020   Tdap 02/04/2014    Past Medical History:  Diagnosis Date   Acne vulgaris 09/15/2015    ADHD    diagnosed in childhood   Anxiety 2009   white coat anxiety, test taking anxiety, no prior medications   Chronic back pain    due to prior football injury, uses OTC ibuprofen   Generalized anxiety disorder 03/21/2016   GERD (gastroesophageal reflux disease)    History of seizure    age 45yo; History of med-induced seizure once only due to Ritalin; no seizures since   Hypogonadism in male    Under the care of urology-supplementing testosterone   Insomnia 09/15/2015   Allergies  Allergen Reactions   Ritalin [Methylphenidate Hcl]     seizure   Past Surgical History:  Procedure Laterality Date   LYMPH NODE DISSECTION  10/08/2003   due to enlarged node, cervical/neck, non-cancerous   NASAL SEPTUM SURGERY     SHOULDER SURGERY  10/07/2006   left RTC and labral repair   VASECTOMY  2021   WISDOM TOOTH EXTRACTION     Family History  Problem Relation Age of Onset   Hypertension Mother    Hyperlipidemia Mother    Heart disease Mother    Diabetes Mother    Depression Mother    Asthma Mother    Hypothyroidism Mother    Breast cancer Mother 28   Hypertension Sister    Colon cancer Maternal Grandfather        Unknown age   Stroke Neg Hx    Social History  Social History Narrative   Marital status/children/pets: Married.  5 people live within the home.   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   Safety:      -Wears a bicycle helmet riding a bike: Yes     -smoke alarm in the home:Yes     - wears seatbelt: Yes     - Feels safe in their relationships: Yes       Allergies as of 04/19/2022       Reactions   Ritalin [methylphenidate Hcl]    seizure        Medication List        Accurate as of April 19, 2022 10:06 AM. If you have any questions, ask your nurse or doctor.          clobetasol 0.05 % external solution Commonly known as: TEMOVATE 1 application Externally Twice a day for 10  day(s)   testosterone cypionate 100 MG/ML injection Commonly known as: DEPOTESTOTERONE CYPIONATE Inject into the muscle.       All past medical history, surgical history, allergies, family history, immunizations andmedications were updated in the EMR today and reviewed under the history and medication portions of their EMR.     No results found for this or any previous visit (from the past 2160 hour(s)).  No results found.  ROS 14 pt review of systems performed and negative (unless mentioned in an HPI)  Objective: BP 119/63   Pulse 73   Temp 98.1 F (36.7 C) (Oral)   Ht 6' 0.75" (1.848 m)   Wt 232 lb (105.2 kg)   SpO2 98%   BMI 30.82 kg/m  Physical Exam Constitutional:      General: He is not in acute distress.    Appearance: Normal appearance. He is not ill-appearing, toxic-appearing or diaphoretic.  HENT:     Head: Normocephalic and atraumatic.     Right Ear: Tympanic membrane, ear canal and external ear normal. There is no impacted cerumen.     Left Ear: Tympanic membrane, ear canal and external ear normal. There is no impacted cerumen.     Nose: Nose normal. No congestion or rhinorrhea.     Mouth/Throat:     Mouth: Mucous membranes are moist.     Pharynx: Oropharynx is clear. No oropharyngeal exudate or posterior oropharyngeal erythema.  Eyes:     General: No scleral icterus.       Right eye: No discharge.        Left eye: No discharge.     Extraocular Movements: Extraocular movements intact.     Pupils: Pupils are equal, round, and reactive to light.  Cardiovascular:     Rate and Rhythm: Normal rate and regular rhythm.     Pulses: Normal pulses.     Heart sounds: Normal heart sounds. No murmur heard.    No friction rub. No gallop.  Pulmonary:     Effort: Pulmonary effort is normal. No respiratory distress.     Breath sounds: Normal breath sounds. No stridor. No wheezing, rhonchi or rales.  Chest:     Chest wall: No tenderness.  Abdominal:     General:  Abdomen is flat. Bowel sounds are normal. There is no distension.     Palpations: Abdomen is soft. There is no mass.     Tenderness: There is no abdominal tenderness. There is no right CVA tenderness, left CVA tenderness, guarding or rebound.     Hernia: No  hernia is present.  Musculoskeletal:        General: No swelling or tenderness. Normal range of motion.     Cervical back: Normal range of motion and neck supple.     Right lower leg: No edema.     Left lower leg: No edema.  Lymphadenopathy:     Cervical: No cervical adenopathy.  Skin:    General: Skin is warm and dry.     Coloration: Skin is not jaundiced.     Findings: No bruising, lesion or rash.  Neurological:     General: No focal deficit present.     Mental Status: He is alert and oriented to person, place, and time. Mental status is at baseline.     Cranial Nerves: No cranial nerve deficit.     Sensory: No sensory deficit.     Motor: No weakness.     Coordination: Coordination normal.     Gait: Gait normal.     Deep Tendon Reflexes: Reflexes normal.  Psychiatric:        Mood and Affect: Mood normal.        Behavior: Behavior normal.        Thought Content: Thought content normal.        Judgment: Judgment normal.     No results found.  Assessment/plan: Jose Briggs is a 36 y.o. male present for CPE Long-term current use of testosterone replacement therapy/Hypogonadism in male - managed by uro - Comprehensive metabolic panel - Hemoglobin A1c - Lipid panel - TSH   Encounter for health maintenance examination in adult Colonoscopy: Family history present in grandparent later in life.  Routine screening at 45 recommended.   Immunizations: tdap UTD 2015, Influenza (encouraged yearly), covid x3 Infectious disease screening: HIV and Hep C completed PSA: - has completed at uro-hrt. Normal. pt was counseled on prostate cancer screenings. No fhx. Patient was encouraged to exercise greater than 150 minutes a week.  Patient was encouraged to choose a diet filled with fresh fruits and vegetables, and lean meats. AVS provided to patient today for education/recommendation on gender specific health and safety maintenance.  Return in about 1 year (around 04/21/2023) for cpe (20 min).   Orders Placed This Encounter  Procedures   Comprehensive metabolic panel   Hemoglobin A1c   Lipid panel   TSH   No orders of the defined types were placed in this encounter.  Referral Orders  No referral(s) requested today     Note is dictated utilizing voice recognition software. Although note has been proof read prior to signing, occasional typographical errors still can be missed. If any questions arise, please do not hesitate to call for verification.  Electronically signed by: Felix Pacini, DO [facility] [place]

## 2023-04-21 ENCOUNTER — Encounter: Payer: 59 | Admitting: Family Medicine

## 2023-05-22 ENCOUNTER — Encounter (INDEPENDENT_AMBULATORY_CARE_PROVIDER_SITE_OTHER): Payer: Self-pay

## 2023-06-18 ENCOUNTER — Ambulatory Visit: Payer: PRIVATE HEALTH INSURANCE | Admitting: Family Medicine

## 2023-06-18 ENCOUNTER — Encounter: Payer: Self-pay | Admitting: Family Medicine

## 2023-06-18 VITALS — BP 102/62 | HR 71 | Temp 98.6°F | Ht 74.0 in | Wt 238.4 lb

## 2023-06-18 DIAGNOSIS — E291 Testicular hypofunction: Secondary | ICD-10-CM

## 2023-06-18 DIAGNOSIS — R400 Somnolence: Secondary | ICD-10-CM

## 2023-06-18 DIAGNOSIS — Z0000 Encounter for general adult medical examination without abnormal findings: Secondary | ICD-10-CM

## 2023-06-18 DIAGNOSIS — Z79890 Hormone replacement therapy (postmenopausal): Secondary | ICD-10-CM

## 2023-06-18 DIAGNOSIS — Z23 Encounter for immunization: Secondary | ICD-10-CM

## 2023-06-18 DIAGNOSIS — Z131 Encounter for screening for diabetes mellitus: Secondary | ICD-10-CM

## 2023-06-18 DIAGNOSIS — R0683 Snoring: Secondary | ICD-10-CM

## 2023-06-18 LAB — COMPREHENSIVE METABOLIC PANEL
ALT: 24 U/L (ref 0–53)
AST: 26 U/L (ref 0–37)
Albumin: 4.3 g/dL (ref 3.5–5.2)
Alkaline Phosphatase: 42 U/L (ref 39–117)
BUN: 12 mg/dL (ref 6–23)
CO2: 30 meq/L (ref 19–32)
Calcium: 9.1 mg/dL (ref 8.4–10.5)
Chloride: 102 meq/L (ref 96–112)
Creatinine, Ser: 1.14 mg/dL (ref 0.40–1.50)
GFR: 83.75 mL/min (ref >60.00)
Glucose, Bld: 76 mg/dL (ref 70–99)
Potassium: 4.7 meq/L (ref 3.5–5.1)
Sodium: 137 meq/L (ref 135–145)
Total Bilirubin: 0.7 mg/dL (ref 0.2–1.2)
Total Protein: 7.4 g/dL (ref 6.0–8.3)

## 2023-06-18 LAB — CBC WITH DIFFERENTIAL/PLATELET
Basophils Absolute: 0 10*3/uL (ref 0.0–0.1)
Basophils Relative: 0.6 % (ref 0.0–3.0)
Eosinophils Absolute: 0.1 10*3/uL (ref 0.0–0.7)
Eosinophils Relative: 2.5 % (ref 0.0–5.0)
HCT: 49.4 % (ref 39.0–52.0)
Hemoglobin: 16.2 g/dL (ref 13.0–17.0)
Lymphocytes Relative: 42.5 % (ref 12.0–46.0)
Lymphs Abs: 1.6 10*3/uL (ref 0.7–4.0)
MCHC: 32.8 g/dL (ref 30.0–36.0)
MCV: 96.6 fl (ref 78.0–100.0)
Monocytes Absolute: 0.5 10*3/uL (ref 0.1–1.0)
Monocytes Relative: 13.9 % — ABNORMAL HIGH (ref 3.0–12.0)
Neutro Abs: 1.5 10*3/uL (ref 1.4–7.7)
Neutrophils Relative %: 40.5 % — ABNORMAL LOW (ref 43.0–77.0)
Platelets: 178 10*3/uL (ref 150.0–400.0)
RBC: 5.11 Mil/uL (ref 4.22–5.81)
RDW: 13.1 % (ref 11.5–15.5)
WBC: 3.7 10*3/uL — ABNORMAL LOW (ref 4.0–10.5)

## 2023-06-18 LAB — LIPID PANEL
Cholesterol: 198 mg/dL (ref 0–200)
HDL: 49.4 mg/dL
LDL Cholesterol: 132 mg/dL — ABNORMAL HIGH (ref 0–99)
NonHDL: 148.13
Total CHOL/HDL Ratio: 4
Triglycerides: 83 mg/dL (ref 0.0–149.0)
VLDL: 16.6 mg/dL (ref 0.0–40.0)

## 2023-06-18 LAB — HEMOGLOBIN A1C: Hgb A1c MFr Bld: 5.5 % (ref 4.6–6.5)

## 2023-06-18 LAB — TSH: TSH: 0.85 u[IU]/mL (ref 0.35–5.50)

## 2023-06-18 NOTE — Progress Notes (Signed)
Patient ID: Jose Briggs, male  DOB: January 24, 1986, 37 y.o.   MRN: [id number] Patient Care Team    Relationship Specialty Notifications Start End  Natalia Leatherwood, DO PCP - General Family Medicine  04/02/21   Moreen Fowler, MD Referring Physician Urology  04/02/21   Laren Boom, DO Consulting Physician Otolaryngology  04/19/22     Chief Complaint  Patient presents with   Annual Exam    Fasting CPE. Declines flu. Will get it at work.    Subjective: Jose Briggs is a 37 y.o. male present for CPE. All past medical history, surgical history, allergies, family history, immunizations, medications and social history were updated in the electronic medical record today. All recent labs, ED visits and hospitalizations within the last year were reviewed.  Health maintenance:  Colonoscopy: Family history present in grandparent later in life.  Routine screening at 45 recommended.   Immunizations: tdap UTD 02/2014, Influenza - will get through work (encouraged yearly), covid x3 Infectious disease screening: HIV and Hep C completed PSA: - has completed at uro-hrt. Normal. pt was counseled on prostate cancer screenings. No fhx. Assistive device: none Oxygen NWG:NFAO Patient has a Dental home. Hospitalizations/ED visits: reviewed  Snoring: Body mass index is 30.61 kg/m. Age 45, male.  Apneic spells witnessed.  Daytime somnolence reported.      06/18/2023   10:07 AM 04/19/2022    9:42 AM 04/02/2021    1:19 PM 08/09/2016    9:42 AM  Depression screen PHQ 2/9  Decreased Interest 0 0 0 0  Down, Depressed, Hopeless 0 0 0 0  PHQ - 2 Score 0 0 0 0  Altered sleeping 0     Tired, decreased energy 0     Change in appetite 0     Feeling bad or failure about yourself  0     Trouble concentrating 0     Moving slowly or fidgety/restless 0     Suicidal thoughts 0     PHQ-9 Score 0     Difficult doing work/chores Not difficult at all         06/18/2023   10:07 AM  GAD 7 :  Generalized Anxiety Score  Nervous, Anxious, on Edge 1  Control/stop worrying 0  Worry too much - different things 1  Trouble relaxing 1  Restless 1  Easily annoyed or irritable 0  Afraid - awful might happen 0  Total GAD 7 Score 4  Anxiety Difficulty Not difficult at all          06/18/2023   10:07 AM 08/09/2016    9:42 AM  Fall Risk   Falls in the past year? 0 No  Number falls in past yr: 0   Injury with Fall? 0   Risk for fall due to : No Fall Risks   Follow up Falls evaluation completed     Immunization History  Administered Date(s) Administered   Hepatitis B, ADULT 08/14/2016, 09/16/2016, 02/24/2017   Hepatitis B, PED/ADOLESCENT 08/14/2016, 09/16/2016, 02/24/2017   Influenza Split 08/01/2020   Influenza-Unspecified 08/01/2020, 07/16/2021, 08/06/2022   PFIZER(Purple Top)SARS-COV-2 Vaccination 12/25/2019, 01/15/2020, 07/28/2020   Tdap 02/04/2014    Past Medical History:  Diagnosis Date   Acne vulgaris 09/15/2015   ADHD    diagnosed in childhood   Anxiety 2009   white coat anxiety, test taking anxiety, no prior medications   Chronic back pain    due to prior football injury, uses OTC ibuprofen   Generalized  anxiety disorder 03/21/2016   GERD (gastroesophageal reflux disease)    History of seizure    age 27yo; History of med-induced seizure once only due to Ritalin; no seizures since   Hypogonadism in male    Under the care of urology-supplementing testosterone   Insomnia 09/15/2015   Allergies  Allergen Reactions   Ritalin [Methylphenidate Hcl]     seizure   Past Surgical History:  Procedure Laterality Date   LYMPH NODE DISSECTION  10/08/2003   due to enlarged node, cervical/neck, non-cancerous   NASAL SEPTUM SURGERY     SHOULDER SURGERY  10/07/2006   left RTC and labral repair   VASECTOMY  2021   WISDOM TOOTH EXTRACTION     Family History  Problem Relation Age of Onset   Hypertension Mother    Hyperlipidemia Mother    Heart disease Mother     Diabetes Mother    Depression Mother    Asthma Mother    Hypothyroidism Mother    Breast cancer Mother 68   Hypertension Sister    Colon cancer Maternal Grandfather        Unknown age   Stroke Neg Hx    Social History   Social History Narrative   Marital status/children/pets: Married.  5 people live within the home.   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   Safety:      -Wears a bicycle helmet riding a bike: Yes     -smoke alarm in the home:Yes     - wears seatbelt: Yes     - Feels safe in their relationships: Yes       Allergies as of 06/18/2023       Reactions   Ritalin [methylphenidate Hcl]    seizure        Medication List        Accurate as of June 18, 2023  2:31 PM. If you have any questions, ask your nurse or doctor.          STOP taking these medications    clobetasol 0.05 % external solution Commonly known as: TEMOVATE Stopped by: Felix Pacini       TAKE these medications    fluticasone 50 MCG/ACT nasal spray Commonly known as: FLONASE Place into the nose.   omeprazole 20 MG capsule Commonly known as: PRILOSEC Take 20 mg by mouth daily.   testosterone cypionate 100 MG/ML injection Commonly known as: DEPOTESTOTERONE CYPIONATE Inject into the muscle.       All past medical history, surgical history, allergies, family history, immunizations andmedications were updated in the EMR today and reviewed under the history and medication portions of their EMR.     No results found for this or any previous visit (from the past 2160 hour(s)).  No results found.  ROS 14 pt review of systems performed and negative (unless mentioned in an HPI)  Objective: BP 102/62   Pulse 71   Temp 98.6 F (37 C) (Temporal)   Ht 6\' 2"  (1.88 m)   Wt 238 lb 6.4 oz (108.1 kg)   SpO2 99%   BMI 30.61 kg/m  Physical Exam Constitutional:      General: He is not in acute distress.     Appearance: Normal appearance. He is not ill-appearing, toxic-appearing or diaphoretic.  HENT:     Head: Normocephalic and atraumatic.     Right Ear: Tympanic membrane, ear canal and external ear normal. There is no impacted  cerumen.     Left Ear: Tympanic membrane, ear canal and external ear normal. There is no impacted cerumen.     Nose: Nose normal. No congestion or rhinorrhea.     Mouth/Throat:     Mouth: Mucous membranes are moist.     Pharynx: Oropharynx is clear. No oropharyngeal exudate or posterior oropharyngeal erythema.  Eyes:     General: No scleral icterus.       Right eye: No discharge.        Left eye: No discharge.     Extraocular Movements: Extraocular movements intact.     Pupils: Pupils are equal, round, and reactive to light.  Cardiovascular:     Rate and Rhythm: Normal rate and regular rhythm.     Pulses: Normal pulses.     Heart sounds: Normal heart sounds. No murmur heard.    No friction rub. No gallop.  Pulmonary:     Effort: Pulmonary effort is normal. No respiratory distress.     Breath sounds: Normal breath sounds. No stridor. No wheezing, rhonchi or rales.  Chest:     Chest wall: No tenderness.  Abdominal:     General: Abdomen is flat. Bowel sounds are normal. There is no distension.     Palpations: Abdomen is soft. There is no mass.     Tenderness: There is no abdominal tenderness. There is no right CVA tenderness, left CVA tenderness, guarding or rebound.     Hernia: No hernia is present.  Musculoskeletal:        General: No swelling or tenderness. Normal range of motion.     Cervical back: Normal range of motion and neck supple.     Right lower leg: No edema.     Left lower leg: No edema.  Lymphadenopathy:     Cervical: No cervical adenopathy.  Skin:    General: Skin is warm and dry.     Coloration: Skin is not jaundiced.     Findings: No bruising, lesion or rash.  Neurological:     General: No focal deficit present.     Mental Status: He is  alert and oriented to person, place, and time. Mental status is at baseline.     Cranial Nerves: No cranial nerve deficit.     Sensory: No sensory deficit.     Motor: No weakness.     Coordination: Coordination normal.     Gait: Gait normal.     Deep Tendon Reflexes: Reflexes normal.  Psychiatric:        Mood and Affect: Mood normal.        Behavior: Behavior normal.        Thought Content: Thought content normal.        Judgment: Judgment normal.     No results found.  Assessment/plan: Jose Briggs is a 37 y.o. male present for CPE Long-term current use of testosterone replacement therapy/Hypogonadism in male - managed by uro- test cyp 100 mg q 14 days - lipid, cbc, cmp  Snoring/Daytime somnolence High risk by stop-bang criteria - Ambulatory referral to Pulmonology for sleep eval   Encounter for health maintenance examination in adult Patient was encouraged to exercise greater than 150 minutes a week. Patient was encouraged to choose a diet filled with fresh fruits and vegetables, and lean meats. AVS provided to patient today for education/recommendation on gender specific health and safety maintenance. TSH Colonoscopy: Family history present in grandparent later in life.  Routine screening at 45 recommended.   Immunizations:  tdap UTD 02/2014, Influenza -will get through work (encouraged yearly), covid x3 Infectious disease screening: HIV and Hep C completed PSA: - has completed at uro-hrt. Normal. pt was counseled on prostate cancer screenings. No fhx.  Return in about 1 year (around 06/18/2024) for cpe (20 min).   Orders Placed This Encounter  Procedures   CBC with Differential/Platelet   Comprehensive metabolic panel   Hemoglobin A1c   TSH   Lipid panel   Ambulatory referral to Pulmonology   No orders of the defined types were placed in this encounter.  Referral Orders         Ambulatory referral to Pulmonology       Note is dictated utilizing voice  recognition software. Although note has been proof read prior to signing, occasional typographical errors still can be missed. If any questions arise, please do not hesitate to call for verification.  Electronically signed by: Felix Pacini, DO [facility] [place]

## 2023-06-18 NOTE — Patient Instructions (Addendum)
Return in about 1 year (around 06/18/2024) for cpe (20 min).        Great to see you today.  I have refilled the medication(s) we provide.   If labs were collected or images ordered, we will inform you of  results once we have received them and reviewed. We will contact you either by echart message, or telephone call.  Please give ample time to the testing facility, and our office to run,  receive and review results. Please do not call inquiring of results, even if you can see them in your chart. We will contact you as soon as we are able. If it has been over 1 week since the test was completed, and you have not yet heard from [place], then please call us.    - echart message- for normal results that have been seen by the patient already.   - telephone call: abnormal results or if patient has not viewed results in their echart.  If a referral to a specialist was entered for you, please call us in 2 weeks if you have not heard from the specialist office to schedule.

## 2024-06-18 ENCOUNTER — Encounter: Payer: PRIVATE HEALTH INSURANCE | Admitting: Family Medicine

## 2024-06-22 ENCOUNTER — Encounter: Payer: Self-pay | Admitting: Family Medicine

## 2024-06-22 ENCOUNTER — Ambulatory Visit (INDEPENDENT_AMBULATORY_CARE_PROVIDER_SITE_OTHER): Payer: PRIVATE HEALTH INSURANCE | Admitting: Family Medicine

## 2024-06-22 VITALS — BP 120/78 | HR 80 | Temp 98.4°F | Ht 74.0 in | Wt 234.0 lb

## 2024-06-22 DIAGNOSIS — Z131 Encounter for screening for diabetes mellitus: Secondary | ICD-10-CM | POA: Diagnosis not present

## 2024-06-22 DIAGNOSIS — Z0000 Encounter for general adult medical examination without abnormal findings: Secondary | ICD-10-CM

## 2024-06-22 DIAGNOSIS — Z79890 Hormone replacement therapy (postmenopausal): Secondary | ICD-10-CM

## 2024-06-22 DIAGNOSIS — Z13220 Encounter for screening for lipoid disorders: Secondary | ICD-10-CM

## 2024-06-22 DIAGNOSIS — Z23 Encounter for immunization: Secondary | ICD-10-CM

## 2024-06-22 LAB — COMPREHENSIVE METABOLIC PANEL WITH GFR
ALT: 32 U/L (ref 0–53)
AST: 28 U/L (ref 0–37)
Albumin: 4.6 g/dL (ref 3.5–5.2)
Alkaline Phosphatase: 41 U/L (ref 39–117)
BUN: 19 mg/dL (ref 6–23)
CO2: 30 meq/L (ref 19–32)
Calcium: 9.4 mg/dL (ref 8.4–10.5)
Chloride: 103 meq/L (ref 96–112)
Creatinine, Ser: 1.25 mg/dL (ref 0.40–1.50)
GFR: 74.45 mL/min (ref >60.00)
Glucose, Bld: 89 mg/dL (ref 70–99)
Potassium: 4.4 meq/L (ref 3.5–5.1)
Sodium: 132 meq/L — ABNORMAL LOW (ref 135–145)
Total Bilirubin: 0.5 mg/dL (ref 0.2–1.2)
Total Protein: 7.5 g/dL (ref 6.0–8.3)

## 2024-06-22 LAB — LIPID PANEL
Cholesterol: 209 mg/dL — ABNORMAL HIGH (ref 0–200)
HDL: 45.5 mg/dL (ref >39.00)
LDL Cholesterol: 138 mg/dL — ABNORMAL HIGH (ref 0–99)
NonHDL: 163.02
Total CHOL/HDL Ratio: 5
Triglycerides: 123 mg/dL (ref 0.0–149.0)
VLDL: 24.6 mg/dL (ref 0.0–40.0)

## 2024-06-22 LAB — HEMOGLOBIN A1C: Hgb A1c MFr Bld: 5.8 % (ref 4.6–6.5)

## 2024-06-22 LAB — CBC
HCT: 50.6 % (ref 39.0–52.0)
Hemoglobin: 16.8 g/dL (ref 13.0–17.0)
MCHC: 33.2 g/dL (ref 30.0–36.0)
MCV: 94.7 fl (ref 78.0–100.0)
Platelets: 188 K/uL (ref 150.0–400.0)
RBC: 5.34 Mil/uL (ref 4.22–5.81)
RDW: 13.1 % (ref 11.5–15.5)
WBC: 3.5 K/uL — ABNORMAL LOW (ref 4.0–10.5)

## 2024-06-22 LAB — TSH: TSH: 1.25 u[IU]/mL (ref 0.35–5.50)

## 2024-06-22 NOTE — Patient Instructions (Addendum)

## 2024-06-22 NOTE — Progress Notes (Signed)
 Patient ID: Jose Briggs, male  DOB: 10/23/1985, 38 y.o.   MRN: [id number] Patient Care Team    Relationship Specialty Notifications Start End  Catherine Charlies LABOR, DO PCP - General Family Medicine  04/02/21   Drucie Punches, MD Referring Physician Urology  04/02/21   Llewellyn Gerard LABOR, DO Consulting Physician Otolaryngology  04/19/22     Chief Complaint  Patient presents with   Annual Exam    Pt is fasting.     Subjective: Jose Briggs is a 38 y.o. male present for CPE. All past medical history, surgical history, allergies, family history, immunizations, medications and social history were updated in the electronic medical record today. All recent labs, ED visits and hospitalizations within the last year were reviewed.  Health maintenance:  Colonoscopy: Family history present in grandparent later in life.  Routine screening at 45 recommended.   Immunizations: tdap updated today, Influenza - will get through work (encouraged yearly) Infectious disease screening: HIV and Hep C completed PSA: - has completed at uro-hrt. Normal. pt was counseled on prostate cancer screenings. No fhx. Assistive device: none Oxygen [id number] Patient has a Dental home. Hospitalizations/ED visits: reviewed     06/22/2024    9:09 AM 06/18/2023   10:07 AM 04/19/2022    9:42 AM 04/02/2021    1:19 PM 08/09/2016    9:42 AM  Depression screen PHQ 2/9  Decreased Interest 0 0 0 0 0  Down, Depressed, Hopeless 0 0 0 0 0  PHQ - 2 Score 0 0 0 0 0  Altered sleeping 1 0     Tired, decreased energy 0 0     Change in appetite 0 0     Feeling bad or failure about yourself  0 0     Trouble concentrating 0 0     Moving slowly or fidgety/restless 0 0     Suicidal thoughts 0 0     PHQ-9 Score 1 0     Difficult doing work/chores Not difficult at all Not difficult at all         06/22/2024    9:10 AM 06/18/2023   10:07 AM  GAD 7 : Generalized Anxiety Score  Nervous, Anxious, on Edge 1 1  Control/stop  worrying 0 0  Worry too much - different things 0 1  Trouble relaxing 1 1  Restless 0 1  Easily annoyed or irritable 0 0  Afraid - awful might happen 0 0  Total GAD 7 Score 2 4  Anxiety Difficulty Not difficult at all Not difficult at all          06/22/2024    9:09 AM 06/18/2023   10:07 AM 08/09/2016    9:42 AM  Fall Risk   Falls in the past year? 0 0 No   Number falls in past yr:  0   Injury with Fall?  0   Risk for fall due to :  No Fall Risks   Follow up Falls evaluation completed Falls evaluation completed      Data saved with a previous flowsheet row definition    Immunization History  Administered Date(s) Administered   Hepatitis B, ADULT 08/14/2016, 09/16/2016, 02/24/2017   Hepatitis B, PED/ADOLESCENT 08/14/2016, 09/16/2016, 02/24/2017   Influenza Split 08/01/2020   Influenza-Unspecified 08/01/2020, 07/16/2021, 08/06/2022   PFIZER(Purple Top)SARS-COV-2 Vaccination 12/25/2019, 01/15/2020, 07/28/2020   Tdap 02/04/2014, 06/22/2024    Past Medical History:  Diagnosis Date   Acne vulgaris 09/15/2015   ADHD  diagnosed in childhood   Anxiety 2009   white coat anxiety, test taking anxiety, no prior medications   Chronic back pain    due to prior football injury, uses OTC ibuprofen   Generalized anxiety disorder 03/21/2016   GERD (gastroesophageal reflux disease)    History of seizure    age 66yo; History of med-induced seizure once only due to Ritalin; no seizures since   Hypogonadism in male    Under the care of urology-supplementing testosterone    Insomnia 09/15/2015   Allergies  Allergen Reactions   Ritalin [Methylphenidate Hcl]     seizure   Past Surgical History:  Procedure Laterality Date   LYMPH NODE DISSECTION  10/08/2003   due to enlarged node, cervical/neck, non-cancerous   NASAL SEPTUM SURGERY     SHOULDER SURGERY  10/07/2006   left RTC and labral repair   VASECTOMY  2021   WISDOM TOOTH EXTRACTION     Family History  Problem Relation Age  of Onset   Hypertension Mother    Hyperlipidemia Mother    Heart disease Mother    Diabetes Mother    Depression Mother    Asthma Mother    Hypothyroidism Mother    Breast cancer Mother 65   Hypertension Sister    Colon cancer Maternal Grandfather        Unknown age   Stroke Neg Hx    Social History   Social History Narrative   Marital status/children/pets: Married.  5 people live within the home.   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   Safety:      -Wears a bicycle helmet riding a bike: Yes     -smoke alarm in the home:Yes     - wears seatbelt: Yes     - Feels safe in their relationships: Yes       Allergies as of 06/22/2024       Reactions   Ritalin [methylphenidate Hcl]    seizure        Medication List        Accurate as of June 22, 2024  9:33 AM. If you have any questions, ask your nurse or doctor.          fluticasone 50 MCG/ACT nasal spray Commonly known as: FLONASE Place into the nose.   omeprazole 20 MG capsule Commonly known as: PRILOSEC Take 20 mg by mouth daily.   testosterone  cypionate 100 MG/ML injection Commonly known as: DEPOTESTOTERONE CYPIONATE Inject into the muscle.       All past medical history, surgical history, allergies, family history, immunizations andmedications were updated in the EMR today and reviewed under the history and medication portions of their EMR.     No results found for this or any previous visit (from the past 2160 hours).  No results found.  ROS 14 pt review of systems performed and negative (unless mentioned in an HPI)  Objective: BP 120/78   Pulse 80   Temp 98.4 F (36.9 C)   Ht 6' 2 (1.88 m)   Wt 234 lb (106.1 kg)   SpO2 98%   BMI 30.04 kg/m  Physical Exam Constitutional:      General: He is not in acute distress.    Appearance: Normal appearance. He is not ill-appearing, toxic-appearing or diaphoretic.  HENT:      Head: Normocephalic and atraumatic.     Right Ear: Tympanic membrane, ear canal and external ear normal. There is  no impacted cerumen.     Left Ear: Tympanic membrane, ear canal and external ear normal. There is no impacted cerumen.     Nose: Nose normal. No congestion or rhinorrhea.     Mouth/Throat:     Mouth: Mucous membranes are moist.     Pharynx: Oropharynx is clear. No oropharyngeal exudate or posterior oropharyngeal erythema.  Eyes:     General: No scleral icterus.       Right eye: No discharge.        Left eye: No discharge.     Extraocular Movements: Extraocular movements intact.     Pupils: Pupils are equal, round, and reactive to light.  Cardiovascular:     Rate and Rhythm: Normal rate and regular rhythm.     Pulses: Normal pulses.     Heart sounds: Normal heart sounds. No murmur heard.    No friction rub. No gallop.  Pulmonary:     Effort: Pulmonary effort is normal. No respiratory distress.     Breath sounds: Normal breath sounds. No stridor. No wheezing, rhonchi or rales.  Chest:     Chest wall: No tenderness.  Abdominal:     General: Abdomen is flat. Bowel sounds are normal. There is no distension.     Palpations: Abdomen is soft. There is no mass.     Tenderness: There is no abdominal tenderness. There is no right CVA tenderness, left CVA tenderness, guarding or rebound.     Hernia: No hernia is present.  Musculoskeletal:        General: No swelling or tenderness. Normal range of motion.     Cervical back: Normal range of motion and neck supple.     Right lower leg: No edema.     Left lower leg: No edema.  Lymphadenopathy:     Cervical: No cervical adenopathy.  Skin:    General: Skin is warm and dry.     Coloration: Skin is not jaundiced.     Findings: No bruising, lesion or rash.  Neurological:     General: No focal deficit present.     Mental Status: He is alert and oriented to person, place, and time. Mental status is at baseline.     Cranial Nerves: No  cranial nerve deficit.     Sensory: No sensory deficit.     Motor: No weakness.     Coordination: Coordination normal.     Gait: Gait normal.     Deep Tendon Reflexes: Reflexes normal.  Psychiatric:        Mood and Affect: Mood normal.        Behavior: Behavior normal.        Thought Content: Thought content normal.        Judgment: Judgment normal.     No results found.  Assessment/plan: TIMO HARTWIG is a 38 y.o. male present for CPE Long-term current use of testosterone  replacement therapy/Hypogonadism in male - managed by uro- test cyp 100 mg q 14 days - lipid, cbc, cmp  Snoring/Daytime somnolence High risk by stop-bang criteria Est Pulmonology for sleep eval  Encounter for health maintenance examination in adult Patient was encouraged to exercise greater than 150 minutes a week. Patient was encouraged to choose a diet filled with fresh fruits and vegetables, and lean meats. AVS provided to patient today for education/recommendation on gender specific health and safety maintenance. Colonoscopy: Family history present in grandparent later in life.  Routine screening at 45 recommended.   Immunizations: tdap -administered today,  Influenza - will get through work (encouraged yearly) Infectious disease screening: HIV and Hep C completed PSA: - has completed at uro-hrt. Normal. pt was counseled on prostate cancer screenings. No fhx.  Return in about 1 year (around 06/23/2025) for cpe (20 min).   Orders Placed This Encounter  Procedures   Tdap vaccine greater than or equal to 7yo IM   CBC   Comprehensive metabolic panel with GFR   Hemoglobin A1c   Lipid panel   TSH   No orders of the defined types were placed in this encounter.  Referral Orders  No referral(s) requested today      Note is dictated utilizing voice recognition software. Although note has been proof read prior to signing, occasional typographical errors still can be missed. If any questions arise,  please do not hesitate to call for verification.  Electronically signed by: Charlies Bellini, DO [facility] [place]

## 2024-06-23 ENCOUNTER — Ambulatory Visit: Payer: Self-pay | Admitting: Family Medicine

## 2025-06-23 ENCOUNTER — Encounter: Payer: PRIVATE HEALTH INSURANCE | Admitting: Family Medicine
